# Patient Record
Sex: Male | Born: 1960 | Race: White | Hispanic: No | Marital: Married | Smoking: Former smoker
Health system: Southern US, Community
[De-identification: ages and names within clinical notes are randomized; demographics above are authoritative.]

## PROBLEM LIST (undated history)

## (undated) DIAGNOSIS — L409 Psoriasis, unspecified: Secondary | ICD-10-CM

## (undated) DIAGNOSIS — J449 Chronic obstructive pulmonary disease, unspecified: Secondary | ICD-10-CM

## (undated) DIAGNOSIS — G4700 Insomnia, unspecified: Secondary | ICD-10-CM

## (undated) DIAGNOSIS — E785 Hyperlipidemia, unspecified: Secondary | ICD-10-CM

## (undated) DIAGNOSIS — Z720 Tobacco use: Secondary | ICD-10-CM

## (undated) DIAGNOSIS — I2510 Atherosclerotic heart disease of native coronary artery without angina pectoris: Secondary | ICD-10-CM

## (undated) HISTORY — DX: Atherosclerotic heart disease of native coronary artery without angina pectoris: I25.10

## (undated) HISTORY — DX: Chronic obstructive pulmonary disease, unspecified: J44.9

## (undated) HISTORY — DX: Hyperlipidemia, unspecified: E78.5

## (undated) HISTORY — DX: Tobacco use: Z72.0

---

## 2000-05-31 ENCOUNTER — Encounter: Admission: RE | Admit: 2000-05-31 | Discharge: 2000-05-31 | Payer: Self-pay | Admitting: Family Medicine

## 2000-05-31 ENCOUNTER — Encounter: Payer: Self-pay | Admitting: Family Medicine

## 2004-10-27 ENCOUNTER — Ambulatory Visit (HOSPITAL_COMMUNITY): Admission: RE | Admit: 2004-10-27 | Discharge: 2004-10-27 | Payer: Self-pay | Admitting: Orthopedic Surgery

## 2004-10-28 ENCOUNTER — Encounter: Admission: RE | Admit: 2004-10-28 | Discharge: 2004-10-28 | Payer: Self-pay | Admitting: Orthopedic Surgery

## 2004-11-04 ENCOUNTER — Encounter: Admission: RE | Admit: 2004-11-04 | Discharge: 2004-11-04 | Payer: Self-pay | Admitting: Orthopedic Surgery

## 2007-08-14 ENCOUNTER — Encounter: Admission: RE | Admit: 2007-08-14 | Discharge: 2007-08-14 | Payer: Self-pay | Admitting: Family Medicine

## 2008-09-06 ENCOUNTER — Emergency Department (HOSPITAL_COMMUNITY): Admission: EM | Admit: 2008-09-06 | Discharge: 2008-09-07 | Payer: Self-pay | Admitting: Emergency Medicine

## 2008-09-12 ENCOUNTER — Inpatient Hospital Stay (HOSPITAL_COMMUNITY): Admission: RE | Admit: 2008-09-12 | Discharge: 2008-09-13 | Payer: Self-pay | Admitting: Cardiology

## 2010-10-01 LAB — BASIC METABOLIC PANEL
BUN: 12 mg/dL (ref 6–23)
BUN: 14 mg/dL (ref 6–23)
Calcium: 8.8 mg/dL (ref 8.4–10.5)
Chloride: 107 mEq/L (ref 96–112)
Creatinine, Ser: 0.79 mg/dL (ref 0.4–1.5)
GFR calc non Af Amer: >60 mL/min (ref >60)
GFR calc non Af Amer: >60 mL/min (ref >60)
Glucose, Bld: 93 mg/dL (ref 70–99)
Potassium: 4.1 mEq/L (ref 3.5–5.1)
Sodium: 139 mEq/L (ref 135–145)

## 2010-10-01 LAB — POCT CARDIAC MARKERS
CKMB, poc: <1 ng/mL — ABNORMAL LOW (ref 1.0–8.0)
Troponin i, poc: <0.05 ng/mL (ref 0.00–0.09)
Troponin i, poc: <0.05 ng/mL (ref 0.00–0.09)

## 2010-10-01 LAB — CBC
HCT: 40 % (ref 39.0–52.0)
Hemoglobin: 14 g/dL (ref 13.0–17.0)
MCHC: 34.9 g/dL (ref 30.0–36.0)
MCV: 93.3 fL (ref 78.0–100.0)
Platelets: 233 10*3/uL (ref 150–400)
Platelets: 258 10*3/uL (ref 150–400)
RDW: 13.1 % (ref 11.5–15.5)
WBC: 11.2 10*3/uL — ABNORMAL HIGH (ref 4.0–10.5)

## 2010-10-01 LAB — DIFFERENTIAL
Basophils Absolute: 0 10*3/uL (ref 0.0–0.1)
Basophils Relative: 1 % (ref 0–1)
Eosinophils Relative: 4 % (ref 0–5)
Lymphocytes Relative: 37 % (ref 12–46)
Neutro Abs: 3.6 10*3/uL (ref 1.7–7.7)

## 2010-11-03 NOTE — Discharge Summary (Signed)
Jacob Fitzpatrick, Jacob Fitzpatrick              ACCOUNT NO.:  [id number]   MEDICAL RECORD NO.:  [id number]          PATIENT TYPE:  INP   LOCATION:  2505                         FACILITY:  [facility]   PHYSICIAN:  Armanda Magic, M.D.     DATE OF BIRTH:  Nov 15, 1960   DATE OF ADMISSION:  09/12/2008  DATE OF DISCHARGE:  09/13/2008                               DISCHARGE SUMMARY   DISCHARGE DIAGNOSES:  1. Coronary artery disease, status post percutaneous coronary      intervention of the left anterior descending, XIENCE V X2.  2. Smoker, smoking cessation counseling.  3. Status post left hand surgery.   HOSPITAL COURSE:  [name] is a 50 year old male, who presented to  the office on September 11, 2008, for an evaluation of chest pain.  The pain  had been occurring for about 3 months.  He described it as an aching in  the upper chest and throat always with exertion.  He does have some mild  shortness of breath, but no other symptoms.   He was then brought into the hospital for a cardiac catheterization on  September 12, 2008.  He was found to have a single 90% LAD lesion.  Dr.  Amil Amen then intervened using a XIENCE V stent x2 to the LAD and he  tolerated this well.  He remained overnight.  His groin was stable and  we discharged him to home the following day.   DISCHARGE LABORATORIES:  Sodium 139, potassium 4.1, BUN 14, and  creatinine 0.78.  Hemoglobin 14.0, hematocrit 40.0, platelets 258, and  white count 11.2.  Chest x-ray from September 06, 2008, showed no acute  pulmonary process.  His EKG shows normal sinus rhythm rate 78 with no  acute ST-T wave changes.  No Q-waves.   DISCHARGE MEDICATIONS:  1. Plavix 75 mg a day.  2. Enteric-coated aspirin 325 mg a day.  3. Vitamin D as before.  4. Sublingual nitroglycerin p.r.n. chest pain.   The patient is discharged to home in stable, but improved condition.  He  is to remain on a low-sodium heart-healthy diet.  Increase activity  slowly.  No lifting over 10  pounds for 1 week.  No driving for 2 days.  Clean cath site gently  with soap and water.  No scrubbing.  Return to Dr. Norris Cross office for  fasting lab work, statin panel, on Monday, September 16, 2008.  He is to  return to see Dr. Mayford Knife on September 30, 2008 at 3 p.m.  Please note, also  we are going to send the patient home on Chantix for smoking cessation.      [name], P.A.      Armanda Magic, M.D.  Electronically Signed    LB/MEDQ  D:  09/13/2008  T:  09/13/2008  Job:  [id number]   cc:   Donia Guiles, M.D.

## 2010-11-03 NOTE — Cardiovascular Report (Signed)
Jacob Fitzpatrick, Jacob Fitzpatrick NO.:  [id number]   MEDICAL RECORD NO.:  [id number]          PATIENT TYPE:  INP   LOCATION:  2505                         FACILITY:  [facility]   PHYSICIAN:  Armanda Magic, M.D.     DATE OF BIRTH:  27-Oct-1960   DATE OF PROCEDURE:  DATE OF DISCHARGE:                            CARDIAC CATHETERIZATION   REFERRING PHYSICIAN:  Donia Guiles, MD   PROCEDURE:  1. Left heart catheterization.  2. Coronary angiography.  3. Left ventriculography.   OPERATOR:  Armanda Magic, MD   INDICATIONS:  Chest pain.   COMPLICATIONS:  None.   IV ACCESS:  Via right femoral artery, 5-French sheath.   INTRAVENOUS MEDICATIONS:  1. Versed 1 mg.  2. Fentanyl 25 mcg.   This is a 50 year old male with a strong history of tobacco use and a  family history of CAD, who presents with typical exertional angina, now  presents for cardiac catheterization.   The patient is brought to the cardiac catheterization laboratory in the  fasting nonsedated state.  Informed consent was obtained.  The patient  was connected to continuous heart rate and pulse oximetry monitoring and  intermittent blood pressure monitor.  The right groin was prepped and  draped in a sterile fashion.  Xylocaine 1% was used for local  anesthesia.  Using the modified Seldinger technique, a 5-French sheath  was placed in the right femoral artery.  Under fluoroscopic guidance, a  5-French JL-4 catheter was placed in the left coronary artery.  Multiple  cine films were taken at 30-degree RAO and 40-degree LAO views.  This  catheter was then exchanged out over a guidewire for a 5-French JR-5  catheter which could not successfully engage the coronary ostium.  The  catheter was exchanged out over a guidewire for a 5-French Williams  right coronary catheter which again initially engaged the coronary  ostium, but then jumped into the conus branch and the catheter was  withdrawn.  The catheter was exchanged out  over a guidewire for a 5-  French angled pigtail catheter which was placed in fluoroscopic guidance  in the left ventricular cavity.  Left ventriculography was performed in  the 30-degree RAO view using total of 30 mL of contrast at 15 mL per  second.  The catheter was then pulled back across the aortic valve with  no significant gradient noted.  Catheter was then exchanged out over a  guidewire for a 5-French JR-5 catheter again which successfully engaged  the coronary ostium of the right coronary artery, and multiple cine  films were taken at 30-degree RAO and 40-degree LAO views.  The catheter  was then removed over a guidewire, and the sheath was exchanged out for  a 6-French femoral artery sheath.  The patient subsequently went on to  PCI of the LAD by Dr. Amil Amen.   RESULTS:  The left main coronary artery is widely patent and bifurcates  to the left anterior descending artery and left circumflex artery.   The left anterior descending artery has a 90-95% stenosis at the takeoff  of the first diagonal branch.  The  first diagonal is widely patent.  The  ongoing LAD gives rise to a second diagonal branch which is widely  patent.  The ongoing LAD traverses to the apex and is widely patent.   The left circumflex is widely patent throughout its course in the AV  groove giving rise to 2 obtuse marginal branches, both of which were  widely patent.   The right coronary artery is widely patent, it bifurcates into a  posterior descending artery and posterolateral artery distally.  The  right coronary artery and PDA and PL are all widely patent.  Left  ventriculography shows normal LV function, EF is 60%.  Aortic pressure  102/62 mmHg, LV pressure 107/0 mmHg, LVEDP 6 mmHg.   ASSESSMENT:  1. One-vessel obstructive coronary disease of the left anterior      descending.  2. Normal left ventricular function.  3. Exertional chest pain secondary to one-vessel obstructive coronary      disease  of the left anterior descending.   PLAN:  PCI of the LAD per Dr. Amil Amen, check a fasting lipid panel.      Armanda Magic, M.D.  Electronically Signed     TT/MEDQ  D:  09/12/2008  T:  09/13/2008  Job:  [id number]   cc:   Donia Guiles, M.D.

## 2013-05-21 ENCOUNTER — Other Ambulatory Visit: Payer: Self-pay | Admitting: Family Medicine

## 2013-05-21 ENCOUNTER — Ambulatory Visit
Admission: RE | Admit: 2013-05-21 | Discharge: 2013-05-21 | Disposition: A | Discharge status: Home / Self Care | Payer: BC Managed Care – PPO | Source: Ambulatory Visit | Attending: Family Medicine | Admitting: Family Medicine

## 2013-05-21 DIAGNOSIS — J209 Acute bronchitis, unspecified: Secondary | ICD-10-CM

## 2013-10-10 ENCOUNTER — Encounter: Payer: Self-pay | Admitting: General Surgery

## 2013-10-10 DIAGNOSIS — F17200 Nicotine dependence, unspecified, uncomplicated: Secondary | ICD-10-CM

## 2013-10-10 DIAGNOSIS — I2510 Atherosclerotic heart disease of native coronary artery without angina pectoris: Secondary | ICD-10-CM

## 2013-10-10 DIAGNOSIS — E785 Hyperlipidemia, unspecified: Secondary | ICD-10-CM

## 2013-10-12 ENCOUNTER — Inpatient Hospital Stay (HOSPITAL_COMMUNITY)
Admission: EM | Admit: 2013-10-12 | Discharge: 2013-10-15 | DRG: 287 | Disposition: A | Discharge status: Home / Self Care | Payer: BC Managed Care – PPO | Attending: Cardiology | Admitting: Cardiology

## 2013-10-12 ENCOUNTER — Emergency Department (HOSPITAL_COMMUNITY): Payer: BC Managed Care – PPO

## 2013-10-12 ENCOUNTER — Encounter (HOSPITAL_COMMUNITY): Payer: Self-pay | Admitting: Emergency Medicine

## 2013-10-12 DIAGNOSIS — I214 Non-ST elevation (NSTEMI) myocardial infarction: Secondary | ICD-10-CM | POA: Diagnosis present

## 2013-10-12 DIAGNOSIS — R079 Chest pain, unspecified: Secondary | ICD-10-CM | POA: Insufficient documentation

## 2013-10-12 DIAGNOSIS — E785 Hyperlipidemia, unspecified: Secondary | ICD-10-CM | POA: Diagnosis present

## 2013-10-12 DIAGNOSIS — I2510 Atherosclerotic heart disease of native coronary artery without angina pectoris: Principal | ICD-10-CM

## 2013-10-12 DIAGNOSIS — I10 Essential (primary) hypertension: Secondary | ICD-10-CM | POA: Diagnosis present

## 2013-10-12 DIAGNOSIS — I200 Unstable angina: Secondary | ICD-10-CM

## 2013-10-12 DIAGNOSIS — Z9861 Coronary angioplasty status: Secondary | ICD-10-CM

## 2013-10-12 DIAGNOSIS — E876 Hypokalemia: Secondary | ICD-10-CM | POA: Diagnosis present

## 2013-10-12 DIAGNOSIS — I213 ST elevation (STEMI) myocardial infarction of unspecified site: Secondary | ICD-10-CM

## 2013-10-12 DIAGNOSIS — Z79899 Other long term (current) drug therapy: Secondary | ICD-10-CM

## 2013-10-12 DIAGNOSIS — Z7982 Long term (current) use of aspirin: Secondary | ICD-10-CM

## 2013-10-12 DIAGNOSIS — L408 Other psoriasis: Secondary | ICD-10-CM | POA: Diagnosis present

## 2013-10-12 DIAGNOSIS — G4700 Insomnia, unspecified: Secondary | ICD-10-CM | POA: Diagnosis present

## 2013-10-12 DIAGNOSIS — F17200 Nicotine dependence, unspecified, uncomplicated: Secondary | ICD-10-CM | POA: Diagnosis present

## 2013-10-12 HISTORY — DX: Insomnia, unspecified: G47.00

## 2013-10-12 HISTORY — DX: Psoriasis, unspecified: L40.9

## 2013-10-12 LAB — CBC
HCT: 43.6 % (ref 39.0–52.0)
Hemoglobin: 15.1 g/dL (ref 13.0–17.0)
MCH: 32.4 pg (ref 26.0–34.0)
MCHC: 34.6 g/dL (ref 30.0–36.0)
MCV: 93.6 fL (ref 78.0–100.0)
PLATELETS: 227 10*3/uL (ref 150–400)
RBC: 4.66 MIL/uL (ref 4.22–5.81)
RDW: 13.5 % (ref 11.5–15.5)
WBC: 14.4 10*3/uL — AB (ref 4.0–10.5)

## 2013-10-12 LAB — BASIC METABOLIC PANEL
BUN: 21 mg/dL (ref 6–23)
CHLORIDE: 102 meq/L (ref 96–112)
CO2: 22 meq/L (ref 19–32)
Calcium: 9 mg/dL (ref 8.4–10.5)
Creatinine, Ser: 0.94 mg/dL (ref 0.50–1.35)
GFR calc Af Amer: >90 mL/min (ref >90)
GFR calc non Af Amer: >90 mL/min (ref >90)
Glucose, Bld: 92 mg/dL (ref 70–99)
Potassium: 4.5 mEq/L (ref 3.7–5.3)
SODIUM: 138 meq/L (ref 137–147)

## 2013-10-12 LAB — TROPONIN I

## 2013-10-12 LAB — PROTIME-INR
INR: 1.01 (ref 0.00–1.49)
Prothrombin Time: 13.1 seconds (ref 11.6–15.2)

## 2013-10-12 LAB — MRSA PCR SCREENING: MRSA by PCR: NEGATIVE

## 2013-10-12 LAB — I-STAT TROPONIN, ED: TROPONIN I, POC: 0.13 ng/mL — AB (ref 0.00–0.08)

## 2013-10-12 MED ORDER — HEPARIN (PORCINE) IN NACL 100-0.45 UNIT/ML-% IJ SOLN
1400.0000 [IU]/h | INTRAMUSCULAR | Status: DC
  Administered 2013-10-12 – 2013-10-14 (×4): 1400 [IU]/h via INTRAVENOUS
  Filled 2013-10-12 (×5): qty 250

## 2013-10-12 MED ORDER — ATORVASTATIN CALCIUM 80 MG PO TABS
80.0000 mg | ORAL_TABLET | Freq: Every day | ORAL | Status: DC
Start: 2013-10-12 — End: 2013-10-13
  Filled 2013-10-12 (×3): qty 1

## 2013-10-12 MED ORDER — METOPROLOL TARTRATE 12.5 MG HALF TABLET
12.5000 mg | ORAL_TABLET | Freq: Two times a day (BID) | ORAL | Status: DC
  Administered 2013-10-12 – 2013-10-15 (×6): 12.5 mg via ORAL
  Filled 2013-10-12 (×10): qty 1

## 2013-10-12 MED ORDER — HEPARIN BOLUS VIA INFUSION
4000.0000 [IU] | Freq: Once | INTRAVENOUS | Status: AC
  Administered 2013-10-12: 4000 [IU] via INTRAVENOUS
  Filled 2013-10-12: qty 4000

## 2013-10-12 MED ORDER — ASPIRIN 81 MG PO CHEW: 324.0000 mg | CHEWABLE_TABLET | Freq: Once | ORAL | Status: DC

## 2013-10-12 MED ORDER — ASPIRIN EC 325 MG PO TBEC
325.0000 mg | DELAYED_RELEASE_TABLET | Freq: Every day | ORAL | Status: DC
Start: 2013-10-12 — End: 2013-10-13
  Administered 2013-10-13: 325 mg via ORAL
  Filled 2013-10-12 (×2): qty 1

## 2013-10-12 MED ORDER — CLOPIDOGREL BISULFATE 75 MG PO TABS
75.0000 mg | ORAL_TABLET | Freq: Every day | ORAL | Status: DC
  Administered 2013-10-13 – 2013-10-15 (×3): 75 mg via ORAL
  Filled 2013-10-12 (×5): qty 1

## 2013-10-12 NOTE — ED Provider Notes (Signed)
CSN: [id number]     Arrival date & time 10/12/13  1653 History   First MD Initiated Contact with Patient 10/12/13 1722     Chief Complaint  Patient presents with  . Chest Pain     (Consider location/radiation/quality/duration/timing/severity/associated sxs/prior Treatment) HPI Comments: Patient presents with chief complaint of chest pain. Patient was tilling his garden today. Shortly after he was finished, he developed right-sided neck pain and pain across his upper chest with mild radiation into his arm. This is associated with nausea and shortness of breath. No palpitations or diaphoresis. Pain began approximately 2:30 PM. It lasted for approximately one hour. It was resolved with nitroglycerin and aspirin. Patient was transported to the hospital by EMS. Patient states that he has been compliant with Plavix. Patient has a strong family history of coronary artery disease. Patient had angina in 2010 and had 2 stents placed after receiving catheterization. He follows up every year with his cardiologist, Dr. Mayford Knifeurner. The onset of this condition was acute. The course is resolved. Aggravating factors: none. Alleviating factors: none.   Patient had similar episode 2 days ago after working outside. Pain was mild and resolved spontaneously.    Patient is a 53 y.o. male presenting with chest pain. The history is provided by the patient, medical records and the spouse.  Chest Pain Associated symptoms: no abdominal pain, no back pain, no cough, no diaphoresis, no fever, no nausea, no palpitations, no shortness of breath and not vomiting     Past Medical History  Diagnosis Date  . Coronary artery disease     s/p PCI of LAD in 2010  . Tobacco abuse   . HLD (hyperlipidemia)    History reviewed. No pertinent past surgical history. History reviewed. No pertinent family history. History  Substance Use Topics  . Smoking status: Current Some Day Smoker -- 0.25 packs/day    Types: Cigarettes  .  Smokeless tobacco: Not on file  . Alcohol Use: Yes     Comment: 2+ serving daily    Review of Systems  Constitutional: Negative for fever and diaphoresis.  Eyes: Negative for redness.  Respiratory: Negative for cough and shortness of breath.   Cardiovascular: Positive for chest pain. Negative for palpitations and leg swelling.  Gastrointestinal: Negative for nausea, vomiting and abdominal pain.  Genitourinary: Negative for dysuria.  Musculoskeletal: Positive for neck pain. Negative for back pain.  Skin: Negative for rash.  Neurological: Negative for syncope and light-headedness.      Allergies  Review of patient's allergies indicates no known allergies.  Home Medications   Prior to Admission medications   Medication Sig Start Date End Date Taking? Authorizing Provider  aspirin 81 MG tablet Take 81 mg by mouth daily.    Historical Provider, MD  cholecalciferol (VITAMIN D) 1000 UNITS tablet Take 1,000 Units by mouth daily.    Historical Provider, MD  Clobetasol Propionate 0.05 % lotion Apply topically 2 (two) times daily.    Historical Provider, MD  clopidogrel (PLAVIX) 75 MG tablet Take 75 mg by mouth daily with breakfast.    Historical Provider, MD  desonide (VERDESO) 0.05 % foam Apply topically 2 (two) times daily.    Historical Provider, MD  hydrOXYzine (ATARAX/VISTARIL) 25 MG tablet Take 25 mg by mouth daily.    Historical Provider, MD  nitroGLYCERIN (NITROSTAT) 0.4 MG SL tablet Place 0.4 mg under the tongue every 5 (five) minutes as needed for chest pain.    Historical Provider, MD  Omega-3 Fatty Acids (FISH OIL)  1000 MG CAPS Take 1,000 mg by mouth daily.    Historical Provider, MD  rosuvastatin (CRESTOR) 20 MG tablet Take 20 mg by mouth daily.    Historical Provider, MD  vitamin C (ASCORBIC ACID) 500 MG tablet Take 500 mg by mouth daily.    Historical Provider, MD   BP 102/76  Pulse 82  Temp(Src) 97.7 F (36.5 C) (Oral)  Resp 22  SpO2 94% Physical Exam  Nursing note  and vitals reviewed. Constitutional: He appears well-developed and well-nourished.  HENT:  Head: Normocephalic and atraumatic.  Mouth/Throat: Mucous membranes are normal. Mucous membranes are not dry.  Eyes: Conjunctivae are normal.  Neck: Trachea normal and normal range of motion. Neck supple. Normal carotid pulses and no JVD present. No muscular tenderness present. Carotid bruit is not present. No tracheal deviation present.  Cardiovascular: Normal rate, regular rhythm, S1 normal, S2 normal, normal heart sounds and intact distal pulses.  Exam reveals no distant heart sounds and no decreased pulses.   No murmur heard. Pulses:      Radial pulses are 2+ on the right side, and 2+ on the left side.  Pulmonary/Chest: Effort normal and breath sounds normal. No respiratory distress. He has no wheezes. He exhibits no tenderness.  Abdominal: Soft. Bowel sounds are normal. He exhibits no pulsatile midline mass. There is no tenderness. There is no rebound and no guarding.  Musculoskeletal: He exhibits no edema.  Neurological: He is alert.  Skin: Skin is warm and dry. He is not diaphoretic. No cyanosis. No pallor.  Psychiatric: He has a normal mood and affect.    ED Course  Procedures (including critical care time) Labs Review Labs Reviewed  CBC - Abnormal; Notable for the following:    WBC 14.4 (*)    All other components within normal limits  I-STAT TROPOININ, ED - Abnormal; Notable for the following:    Troponin i, poc 0.13 (*)    All other components within normal limits  BASIC METABOLIC PANEL  TROPONIN I    Imaging Review Dg Chest Port 1 View  10/12/2013   CLINICAL DATA:  Chest and neck pain, short of breath  EXAM: PORTABLE CHEST - 1 VIEW  COMPARISON:  Prior chest x-ray 05/21/2013  FINDINGS: Stable cardiac and mediastinal contours. Slightly increased bibasilar atelectasis. Stable bronchitic changes and mild interstitial prominence. No pneumothorax or effusion. No pulmonary edema or focal  consolidation. No acute osseous abnormality.  IMPRESSION: Mild bibasilar subsegmental atelectasis.  Otherwise, stable chest x-ray.   Electronically Signed   By: Malachy Moan M.D.   On: 10/12/2013 17:30     EKG Interpretation   Date/Time:  Friday October 12 2013 17:04:47 EDT Ventricular Rate:  87 PR Interval:  147 QRS Duration: 94 QT Interval:  368 QTC Calculation: 443 R Axis:   80 Text Interpretation:  Sinus rhythm No significant change since last  tracing Confirmed by GOLDSTON  MD, SCOTT ([id number]) on 10/12/2013 5:55:18 PM      6:01 PM Patient seen and examined. Work-up initiated. EKG reviewed. Medications ordered. Discussed with Dr. Criss Alvine. Will call cardiology for consult.   Vital signs reviewed and are as follows: Filed Vitals:   10/12/13 1715  BP: 102/76  Pulse: 82  Temp:   Resp: 22   6:05 PM Cardiology consulted. They will see.   Cards admit.    MDM   Final diagnoses:  Chest pain   Admit for CP, possible NSTEMI.     Renne Crigler, PA-C 10/12/13 2308

## 2013-10-12 NOTE — Progress Notes (Signed)
ANTICOAGULATION CONSULT NOTE - Initial Consult  Pharmacy Consult for heparin Indication: chest pain/ACS  No Known Allergies  Patient Measurements:   Heparin Dosing Weight: 90kg  Vital Signs: Temp: 97.7 F (36.5 C) (04/24 1704) Temp src: Oral (04/24 1704) BP: 123/74 mmHg (04/24 1900) Pulse Rate: 84 (04/24 1900)  Labs:  Recent Labs  10/12/13 1703 10/12/13 1746  HGB 15.1  --   HCT 43.6  --   PLT 227  --   CREATININE 0.94  --   TROPONINI  --  <0.30    CrCl is unknown because there is no height on file for the current visit.   Medical History: Past Medical History  Diagnosis Date  . Coronary artery disease     s/p PCI of LAD in 2010  . Tobacco abuse   . HLD (hyperlipidemia)     Medications:   (Not in a hospital admission) Scheduled:  . aspirin EC  325 mg Oral Daily  . [START ON 10/13/2013] atorvastatin  80 mg Oral q1800  . [START ON 10/13/2013] clopidogrel  75 mg Oral Q breakfast  . metoprolol tartrate  12.5 mg Oral BID   Infusions:    Assessment: 53 yo with a hx of CAD who was admitted for CP. Starting IV heparin to r/o MI.   Goal of Therapy:  Heparin level 0.3-0.7 units/ml Monitor platelets by anticoagulation protocol: Yes   Plan:   Heparin bolus 4000 units x1 Heparin drip at 1400 units/hr Check 6 hr heparin  Daily heparin level and CBC

## 2013-10-12 NOTE — ED Notes (Signed)
Report called to Alinda Money, RN 2H.

## 2013-10-12 NOTE — ED Notes (Addendum)
Per ems: pt from home for eval of cp that started while he was working in yard. Pt went and sat down and states cp didn't get any better. Pt took 1 nitro at home took which decreased pain from 9/10 to 2/10 and fire dept gave pt another nitro took pain from  2/10 to 0/10. EKG unremarkable per EMS report. Pt denies any n/v/d/sob. nad noted, skin warm and dry. Axo x4.

## 2013-10-12 NOTE — H&P (Addendum)
Patient ID: Jacob Fitzpatrick MRN: [id number], DOB/AGE: 10/11/1960   Admit date: 10/12/2013   Primary Physician: [name], MD Primary Cardiologist: Dr Carolanne Grumbling  Pt. Profile:  Chest pain, NSTEMI  Problem List  Past Medical History  Diagnosis Date  . Coronary artery disease     s/p PCI of LAD in 2010  . Tobacco abuse   . HLD (hyperlipidemia)     History reviewed. No pertinent past surgical history.   Allergies  No Known Allergies  HPI  Patient is a 53 y.o. male with a PMHx of tobacco use, HTN, HLP, known CAD, s/p PCI LAD in for UA  In 08/2008 who presented to the ER with chief complaint of chest pain that started while he was working in the back yard today.  Shortly after he was finished, he developed right-sided neck pain and pain across his upper chest with mild radiation into his arm. This is associated with nausea and shortness of breath. No palpitations or diaphoresis. Pain began approximately 2:30 PM. It lasted for approximately one hour. It was resolved with nitroglycerin and aspirin. Patient was transported to the hospital by EMS. Patient states that he has been compliant with Plavix. He follows up every year with his cardiologist, Dr. Mayford Knife. Patient had similar episode 2 days ago after working outside. Pain was mild and resolved spontaneously.   Home Medications  Prior to Admission medications   Medication Sig Start Date End Date Taking? Authorizing Provider  aspirin 81 MG tablet Take 81 mg by mouth daily.    Historical Provider, MD  cholecalciferol (VITAMIN D) 1000 UNITS tablet Take 1,000 Units by mouth daily.    Historical Provider, MD  Clobetasol Propionate 0.05 % lotion Apply topically 2 (two) times daily.    Historical Provider, MD  clopidogrel (PLAVIX) 75 MG tablet Take 75 mg by mouth daily with breakfast.    Historical Provider, MD  desonide (VERDESO) 0.05 % foam Apply topically 2 (two) times daily.    Historical Provider, MD  hydrOXYzine  (ATARAX/VISTARIL) 25 MG tablet Take 25 mg by mouth daily.    Historical Provider, MD  nitroGLYCERIN (NITROSTAT) 0.4 MG SL tablet Place 0.4 mg under the tongue every 5 (five) minutes as needed for chest pain.    Historical Provider, MD  Omega-3 Fatty Acids (FISH OIL) 1000 MG CAPS Take 1,000 mg by mouth daily.    Historical Provider, MD  rosuvastatin (CRESTOR) 20 MG tablet Take 20 mg by mouth daily.    Historical Provider, MD  vitamin C (ASCORBIC ACID) 500 MG tablet Take 500 mg by mouth daily.    Historical Provider, MD   Family History  History reviewed. No pertinent family history.  Social History  History   Social History  . Marital Status: Married    Spouse Name: [name]    Number of Children: [name]  . Years of Education: [name]   Occupational History  . Not on file.   Social History Main Topics  . Smoking status: Current Some Day Smoker -- 0.25 packs/day    Types: Cigarettes  . Smokeless tobacco: Not on file  . Alcohol Use: Yes     Comment: 2+ serving daily  . Drug Use: No  . Sexual Activity: Not on file   Other Topics Concern  . Not on file   Social History Narrative  . No narrative on file     Review of Systems General:  No chills, fever, night sweats or weight changes.  Cardiovascular:  No  chest pain, dyspnea on exertion, edema, orthopnea, palpitations, paroxysmal nocturnal dyspnea. Dermatological: No rash, lesions/masses Respiratory: No cough, dyspnea Urologic: No hematuria, dysuria Abdominal:   No nausea, vomiting, diarrhea, bright red blood per rectum, melena, or hematemesis Neurologic:  No visual changes, wkns, changes in mental status. All other systems reviewed and are otherwise negative except as noted above.  Physical Exam  Blood pressure 102/76, pulse 82, temperature 97.7 F (36.5 C), temperature source Oral, resp. rate 22, SpO2 94.00%.  General: Pleasant, NAD Psych: Normal affect. Neuro: Alert and oriented X 3. Moves all extremities spontaneously. HEENT:  Normal  Neck: Supple without bruits or JVD. Lungs:  Resp regular and unlabored, CTA. Heart: RRR no s3, s4, or murmurs. Abdomen: Soft, non-tender, non-distended, BS + x 4.  Extremities: No clubbing, cyanosis or edema. DP/PT/Radials 2+ and equal bilaterally.  Labs  No results found for this basename: CKTOTAL, CKMB, TROPONINI,  in the last 72 hours Lab Results  Component Value Date   WBC 14.4* 10/12/2013   HGB 15.1 10/12/2013   HCT 43.6 10/12/2013   MCV 93.6 10/12/2013   PLT 227 10/12/2013   Radiology/Studies  Dg Chest Port 1 View  10/12/2013   CLINICAL DATA:  Chest and neck pain, short of breath  EXAM: PORTABLE CHEST - 1 VIEW  COMPARISON:  Prior chest x-ray 05/21/2013  FINDINGS: Stable cardiac and mediastinal contours. Slightly increased bibasilar atelectasis. Stable bronchitic changes and mild interstitial prominence. No pneumothorax or effusion. No pulmonary edema or focal consolidation. No acute osseous abnormality.  IMPRESSION: Mild bibasilar subsegmental atelectasis.  Otherwise, stable chest x-ray.   Electronically Signed   By: Malachy MoanHeath  McCullough M.D.   On: 10/12/2013 17:30   Echocardiogram - none  Cath 09/12/2008 ASSESSMENT:  1. One-vessel obstructive coronary disease of the left anterior  descending. PCI of the LAD  2. Normal left ventricular function.  3. Exertional chest pain secondary to one-vessel obstructive coronary  disease of the left anterior descending.   ECG: SR, early repolarization in the inferior leads, otherwise normal, no prior available for comparison    ASSESSMENT AND PLAN  53 year old male with known CAD, s/p PCI to LAD in 2010  1. NSTEMI - with typical chest pain, we will start Heparin drip, rosuvastatin, ASA, continue plavix, start low dose betablocker - metoprolol 12.5 mg po BID We will follow ECGs and enzymes trend, unless worsening symptoms we will plan for a cath on Monday   2. Smoking cessation - counseling provided, the patient will need nicotine  patches   3. HTN - controlled  4. HLP - start rosuvastatin 40 mg po daily  Signed, Lars MassonKatarina H Breonia Kirstein, MD, [facility] 10/12/2013, 6:15 PM

## 2013-10-13 LAB — BASIC METABOLIC PANEL
BUN: 16 mg/dL (ref 6–23)
CALCIUM: 8.8 mg/dL (ref 8.4–10.5)
CO2: 19 mEq/L (ref 19–32)
Chloride: 105 mEq/L (ref 96–112)
Creatinine, Ser: 0.78 mg/dL (ref 0.50–1.35)
GFR calc non Af Amer: >90 mL/min (ref >90)
Glucose, Bld: 102 mg/dL — ABNORMAL HIGH (ref 70–99)
Potassium: 3.7 mEq/L (ref 3.7–5.3)
Sodium: 140 mEq/L (ref 137–147)

## 2013-10-13 LAB — HEPARIN LEVEL (UNFRACTIONATED)
Heparin Unfractionated: 0.43 IU/mL (ref 0.30–0.70)
Heparin Unfractionated: 0.58 IU/mL (ref 0.30–0.70)

## 2013-10-13 LAB — CBC
HCT: 42.2 % (ref 39.0–52.0)
Hemoglobin: 14.3 g/dL (ref 13.0–17.0)
MCH: 31.9 pg (ref 26.0–34.0)
MCHC: 33.9 g/dL (ref 30.0–36.0)
MCV: 94.2 fL (ref 78.0–100.0)
PLATELETS: 213 10*3/uL (ref 150–400)
RBC: 4.48 MIL/uL (ref 4.22–5.81)
RDW: 13.7 % (ref 11.5–15.5)
WBC: 11.8 10*3/uL — AB (ref 4.0–10.5)

## 2013-10-13 LAB — TROPONIN I
Troponin I: 0.42 ng/mL (ref <0.30)
Troponin I: 0.49 ng/mL (ref <0.30)

## 2013-10-13 MED ORDER — HYDROXYZINE HCL 50 MG PO TABS
50.0000 mg | ORAL_TABLET | Freq: Every day | ORAL | Status: DC
  Administered 2013-10-13 – 2013-10-14 (×2): 50 mg via ORAL
  Filled 2013-10-13 (×3): qty 1

## 2013-10-13 MED ORDER — ROSUVASTATIN CALCIUM 20 MG PO TABS
20.0000 mg | ORAL_TABLET | Freq: Every day | ORAL | Status: DC
  Administered 2013-10-13 – 2013-10-14 (×2): 20 mg via ORAL
  Filled 2013-10-13 (×4): qty 1

## 2013-10-13 MED ORDER — POTASSIUM CHLORIDE 20 MEQ/15ML (10%) PO LIQD
ORAL | Status: AC
  Filled 2013-10-13: qty 30

## 2013-10-13 MED ORDER — SODIUM CHLORIDE 0.9 % IV SOLN
INTRAVENOUS | Status: DC
  Administered 2013-10-13: 08:00:00 via INTRAVENOUS

## 2013-10-13 MED ORDER — ASPIRIN EC 81 MG PO TBEC
81.0000 mg | DELAYED_RELEASE_TABLET | Freq: Every day | ORAL | Status: DC
  Administered 2013-10-14: 81 mg via ORAL
  Filled 2013-10-13: qty 1

## 2013-10-13 MED ORDER — POTASSIUM CHLORIDE 20 MEQ/15ML (10%) PO LIQD
40.0000 meq | Freq: Once | ORAL | Status: AC
  Administered 2013-10-13: 40 meq via ORAL
  Filled 2013-10-13: qty 30

## 2013-10-13 MED ORDER — POTASSIUM CHLORIDE CRYS ER 20 MEQ PO TBCR
40.0000 meq | EXTENDED_RELEASE_TABLET | Freq: Once | ORAL | Status: AC
  Administered 2013-10-15: 10:00:00 40 meq via ORAL
  Filled 2013-10-13 (×3): qty 2

## 2013-10-13 NOTE — Progress Notes (Signed)
ANTICOAGULATION CONSULT NOTE - Follow Up Consult  Pharmacy Consult for heparin Indication: NSTEMI  Labs:  Recent Labs  10/12/13 1703 10/12/13 1746 10/12/13 1928 10/13/13 0115 10/13/13 0350  HGB 15.1  --   --   --  14.3  HCT 43.6  --   --   --  42.2  PLT 227  --   --   --  213  LABPROT  --   --  13.1  --   --   INR  --   --  1.01  --   --   HEPARINUNFRC  --   --   --   --  0.43  CREATININE 0.94  --   --  0.78  --   TROPONINI  --  <0.30  --  0.42*  --     Assessment/Plan:  53yo male therapeutic on heparin with initial dosing for NSTEMI. Will continue gtt at current rate and confirm stable with additional level.   Vernard Gambles, PharmD, BCPS  10/13/2013,4:56 AM

## 2013-10-13 NOTE — Progress Notes (Addendum)
    Subjective:  Feeling well this morning, no chest pain. Family present. [birth year] is [birth year].  Objective:  Vital Signs in the last 24 hours: Temp:  [97.5 F (36.4 C)-98.2 F (36.8 C)] 98.2 F (36.8 C) (04/25 1201) Pulse Rate:  [46-98] 65 (04/25 1201) Resp:  [18-26] 18 (04/25 1201) BP: (101-137)/(56-86) 102/70 mmHg (04/25 1201) SpO2:  [92 %-96 %] 96 % (04/25 1201) Weight:  [194 lb 3.6 oz (88.1 kg)-196 lb (88.905 kg)] 194 lb 3.6 oz (88.1 kg) (04/25 0355)  Intake/Output from previous day: 04/24 0701 - 04/25 0700 In: 196.3 [I.V.:196.3] Out: -    Physical Exam: General: Well developed, well nourished, in no acute distress. Head:  Normocephalic and atraumatic. Lungs: Clear to auscultation and percussion. Heart: Normal S1 and S2.  No murmur, rubs or gallops.  Abdomen: soft, non-tender, positive bowel sounds. Protuberant Extremities: No clubbing or cyanosis. No edema. Neurologic: Alert and oriented x 3. Skin: Warm dry and intact    Lab Results:  Recent Labs  10/12/13 1703 10/13/13 0350  WBC 14.4* 11.8*  HGB 15.1 14.3  PLT 227 213    Recent Labs  10/12/13 1703 10/13/13 0115  NA 138 140  K 4.5 3.7  CL 102 105  CO2 22 19  GLUCOSE 92 102*  BUN 21 16  CREATININE 0.94 0.78    Recent Labs  10/13/13 0115 10/13/13 0730  TROPONINI 0.42* 0.49*    Imaging: Dg Chest Port 1 View  10/12/2013   CLINICAL DATA:  Chest and neck pain, short of breath  EXAM: PORTABLE CHEST - 1 VIEW  COMPARISON:  Prior chest x-ray 05/21/2013  FINDINGS: Stable cardiac and mediastinal contours. Slightly increased bibasilar atelectasis. Stable bronchitic changes and mild interstitial prominence. No pneumothorax or effusion. No pulmonary edema or focal consolidation. No acute osseous abnormality.  IMPRESSION: Mild bibasilar subsegmental atelectasis.  Otherwise, stable chest x-ray.   Electronically Signed   By: Malachy Moan M.D.   On: 10/12/2013 17:30   Personally viewed.   Telemetry: No  adverse arrhythmias Personally viewed.   EKG:  4/24 and 4/25-normal, no ST segment changes  . [START ON 10/14/2013] aspirin EC  81 mg Oral Daily  . atorvastatin  80 mg Oral q1800  . clopidogrel  75 mg Oral Q breakfast  . metoprolol tartrate  12.5 mg Oral BID  . potassium chloride  40 mEq Oral Once   Assessment/Plan:  Active Problems:   Chest pain   NSTEMI (non-ST elevated myocardial infarction)   53 year old male with non-ST elevation myocardial infarction prior history of PCI to LAD in 2010, tobacco use, hyperlipidemia.  1. Non-ST elevation myocardial infarction -Cardiac catheterization Monday -IV heparin -Aspirin, beta blocker, statin  2. leukocytosis-improved from 14 down to 11.8. Likely reactive. Watch for any signs of fever or infection given his Humira.  3. Mild hypokalemia-replete potassium greater than 4  4. Psoriasis-taking Humira injections. He is supposed to get one on Monday. I asked him to postpone this because of catheterization.  5. Hyperlipidemia -- he takes Crestor at home 20 mg. We will change. He has intolerance to Lipitor and simvastatin.  6. Insomnia-he requested hydroxyzine 50 mg nightly.  7. Tobacco use-tobacco cessation.  Donato Schultz 10/13/2013, 12:57 PM

## 2013-10-13 NOTE — Progress Notes (Signed)
ANTICOAGULATION CONSULT NOTE - Follow Up Consult  Pharmacy Consult for heparin Indication: chest pain/ACS  No Known Allergies  Patient Measurements: Height: 6\' 1"  (185.4 cm) Weight: 194 lb 3.6 oz (88.1 kg) IBW/kg (Calculated) : 79.9 Heparin Dosing Weight: 88 kg  Vital Signs: Temp: 97.5 F (36.4 C) (04/25 0728) Temp src: Oral (04/25 0728) BP: 117/70 mmHg (04/25 0728) Pulse Rate: 67 (04/25 0728)  Labs:  Recent Labs  10/12/13 1703 10/12/13 1746 10/12/13 1928 10/13/13 0115 10/13/13 0350 10/13/13 0730  HGB 15.1  --   --   --  14.3  --   HCT 43.6  --   --   --  42.2  --   PLT 227  --   --   --  213  --   LABPROT  --   --  13.1  --   --   --   INR  --   --  1.01  --   --   --   HEPARINUNFRC  --   --   --   --  0.43 0.58  CREATININE 0.94  --   --  0.78  --   --   TROPONINI  --  <0.30  --  0.42*  --  0.49*    Estimated Creatinine Clearance: 119.3 ml/min (by C-G formula based on Cr of 0.78).   Medications:  Scheduled:  . aspirin EC  325 mg Oral Daily  . atorvastatin  80 mg Oral q1800  . clopidogrel  75 mg Oral Q breakfast  . metoprolol tartrate  12.5 mg Oral BID    Assessment: 53 yo M with a known history of CAD (s/p PCI to LAD in 2010), admitted for chest pain, and diagnosed with NSTEMI. Pharmacy has been consulted to manage his heparin anticoagulation therapy.   His heparin level (though checked earlier than we typically would in an attempt to prevent multiple blood draws for patient preference) remains therapeutic at 0.58. His CBC remains stable, and no bleeding issues have been noted.  Patient has a planned cath procedure for Monday (4/27).  Goal of Therapy:  Heparin level 0.3-0.7 units/ml Monitor platelets by anticoagulation protocol: Yes   Plan:  -Continue heparin drip at 1400 units/hr. -Check daily heparin level and CBC -F/u bleeding complications, cath findings, anticoagulation plans after cath  Racquel Arkin C. Angelin Cutrone, PharmD Clinical  Pharmacist-Resident Pager: [phone] Pharmacy: [phone] 10/13/2013 9:28 AM

## 2013-10-14 LAB — CBC
HCT: 45.4 % (ref 39.0–52.0)
Hemoglobin: 15.5 g/dL (ref 13.0–17.0)
MCH: 32.3 pg (ref 26.0–34.0)
MCHC: 34.1 g/dL (ref 30.0–36.0)
MCV: 94.6 fL (ref 78.0–100.0)
PLATELETS: 203 10*3/uL (ref 150–400)
RBC: 4.8 MIL/uL (ref 4.22–5.81)
RDW: 13.5 % (ref 11.5–15.5)
WBC: 10.5 10*3/uL (ref 4.0–10.5)

## 2013-10-14 LAB — BASIC METABOLIC PANEL
BUN: 16 mg/dL (ref 6–23)
CALCIUM: 8.7 mg/dL (ref 8.4–10.5)
CHLORIDE: 105 meq/L (ref 96–112)
CO2: 23 mEq/L (ref 19–32)
Creatinine, Ser: 0.87 mg/dL (ref 0.50–1.35)
GFR calc Af Amer: >90 mL/min (ref >90)
GFR calc non Af Amer: >90 mL/min (ref >90)
Glucose, Bld: 100 mg/dL — ABNORMAL HIGH (ref 70–99)
Potassium: 4.6 mEq/L (ref 3.7–5.3)
SODIUM: 142 meq/L (ref 137–147)

## 2013-10-14 LAB — HEPARIN LEVEL (UNFRACTIONATED): Heparin Unfractionated: 0.58 IU/mL (ref 0.30–0.70)

## 2013-10-14 MED ORDER — SODIUM CHLORIDE 0.9 % IJ SOLN: 3.0000 mL | INTRAMUSCULAR | Status: DC | PRN

## 2013-10-14 MED ORDER — SODIUM CHLORIDE 0.9 % IV SOLN
INTRAVENOUS | Status: DC
  Administered 2013-10-14: 15:00:00 via INTRAVENOUS

## 2013-10-14 MED ORDER — SODIUM CHLORIDE 0.9 % IJ SOLN
3.0000 mL | Freq: Two times a day (BID) | INTRAMUSCULAR | Status: DC
  Administered 2013-10-14: 3 mL via INTRAVENOUS

## 2013-10-14 MED ORDER — ASPIRIN 81 MG PO CHEW
81.0000 mg | CHEWABLE_TABLET | ORAL | Status: AC
  Administered 2013-10-15: 81 mg via ORAL
  Filled 2013-10-14: qty 1

## 2013-10-14 MED ORDER — SODIUM CHLORIDE 0.9 % IV SOLN: 250.0000 mL | INTRAVENOUS | Status: DC | PRN

## 2013-10-14 MED ORDER — DIAZEPAM 5 MG PO TABS
5.0000 mg | ORAL_TABLET | ORAL | Status: AC
  Administered 2013-10-15: 5 mg via ORAL
  Filled 2013-10-14: qty 1

## 2013-10-14 MED ORDER — ASPIRIN EC 81 MG PO TBEC: 81.0000 mg | DELAYED_RELEASE_TABLET | Freq: Every day | ORAL | Status: DC

## 2013-10-14 NOTE — Progress Notes (Signed)
ANTICOAGULATION CONSULT NOTE - Follow Up Consult  Pharmacy Consult for heparin Indication: chest pain/ACS  Allergies  Allergen Reactions  . Statins     Intolerant to atorvastatin and simvastatin. Can take rosuvastatin    Patient Measurements: Height: 6\' 1"  (185.4 cm) Weight: 194 lb 3.6 oz (88.1 kg) IBW/kg (Calculated) : 79.9 Heparin Dosing Weight: 88 kg  Vital Signs: Temp: 98.1 F (36.7 C) (04/26 0820) Temp src: Oral (04/26 0820) BP: 104/73 mmHg (04/26 0820) Pulse Rate: 61 (04/26 0326)  Labs:  Recent Labs  10/12/13 1703 10/12/13 1746 10/12/13 1928 10/13/13 0115 10/13/13 0350 10/13/13 0730 10/14/13 0335  HGB 15.1  --   --   --  14.3  --  15.5  HCT 43.6  --   --   --  42.2  --  45.4  PLT 227  --   --   --  213  --  203  LABPROT  --   --  13.1  --   --   --   --   INR  --   --  1.01  --   --   --   --   HEPARINUNFRC  --   --   --   --  0.43 0.58 0.58  CREATININE 0.94  --   --  0.78  --   --  0.87  TROPONINI  --  <0.30  --  0.42*  --  0.49*  --     Estimated Creatinine Clearance: 109.7 ml/min (by C-G formula based on Cr of 0.87).   Medications:  Scheduled:  . aspirin EC  81 mg Oral Daily  . clopidogrel  75 mg Oral Q breakfast  . hydrOXYzine  50 mg Oral QHS  . metoprolol tartrate  12.5 mg Oral BID  . potassium chloride  40 mEq Oral Once  . rosuvastatin  20 mg Oral q1800    Assessment: 53 yo M with a known history of CAD (s/p PCI to LAD in 2010), admitted for chest pain, and diagnosed with NSTEMI. Pharmacy has been consulted to manage his heparin anticoagulation therapy.   His heparin level remains therapeutic at 0.58. His CBC remains stable, and no bleeding issues have been noted.  Patient has a planned cath procedure for Monday (4/27).  Goal of Therapy:  Heparin level 0.3-0.7 units/ml Monitor platelets by anticoagulation protocol: Yes   Plan:  -Continue heparin drip at 1400 units/hr. -Check daily heparin level and CBC -F/u bleeding complications,  cath findings, anticoagulation plans after cath  Nickayla Mcinnis C. Wilhelm Ganaway, PharmD Clinical Pharmacist-Resident Pager: [phone] Pharmacy: [phone] 10/14/2013 8:47 AM

## 2013-10-14 NOTE — Progress Notes (Signed)
Utilization review completed.  

## 2013-10-14 NOTE — Progress Notes (Signed)
    Subjective:  Feeling well this morning, no chest pain. Family present. His birthday is [birth year].  Objective:  Vital Signs in the last 24 hours: Temp:  [97.6 F (36.4 C)-98.3 F (36.8 C)] 98.1 F (36.7 C) (04/26 0820) Pulse Rate:  [61-70] 61 (04/26 0326) Resp:  [18-20] 20 (04/26 0820) BP: (101-115)/(61-74) 104/73 mmHg (04/26 0820) SpO2:  [91 %-96 %] 91 % (04/26 0820)  Intake/Output from previous day: 04/25 0701 - 04/26 0700 In: 1032 [P.O.:480; I.V.:552] Out: -    Physical Exam: General: Well developed, well nourished, in no acute distress. Head:  Normocephalic and atraumatic. Lungs: Clear to auscultation and percussion. Heart: Normal S1 and S2.  No murmur, rubs or gallops.  Abdomen: soft, non-tender, positive bowel sounds. Protuberant Extremities: No clubbing or cyanosis. No edema. Neurologic: Alert and oriented x 3. Skin: Warm dry and intact    Lab Results:  Recent Labs  10/13/13 0350 10/14/13 0335  WBC 11.8* 10.5  HGB 14.3 15.5  PLT 213 203    Recent Labs  10/13/13 0115 10/14/13 0335  NA 140 142  K 3.7 4.6  CL 105 105  CO2 19 23  GLUCOSE 102* 100*  BUN 16 16  CREATININE 0.78 0.87    Recent Labs  10/13/13 0115 10/13/13 0730  TROPONINI 0.42* 0.49*    Imaging: Dg Chest Port 1 View  10/12/2013   CLINICAL DATA:  Chest and neck pain, short of breath  EXAM: PORTABLE CHEST - 1 VIEW  COMPARISON:  Prior chest x-ray 05/21/2013  FINDINGS: Stable cardiac and mediastinal contours. Slightly increased bibasilar atelectasis. Stable bronchitic changes and mild interstitial prominence. No pneumothorax or effusion. No pulmonary edema or focal consolidation. No acute osseous abnormality.  IMPRESSION: Mild bibasilar subsegmental atelectasis.  Otherwise, stable chest x-ray.   Electronically Signed   By: Heath  McCullough M.D.   On: 10/12/2013 17:30   Personally viewed.   Telemetry: No adverse arrhythmias Personally viewed.   EKG:  4/24 and 4/25-normal, no ST  segment changes  . aspirin EC  81 mg Oral Daily  . clopidogrel  75 mg Oral Q breakfast  . hydrOXYzine  50 mg Oral QHS  . metoprolol tartrate  12.5 mg Oral BID  . potassium chloride  40 mEq Oral Once  . rosuvastatin  20 mg Oral q1800   Assessment/Plan:  Active Problems:   Chest pain   NSTEMI (non-ST elevated myocardial infarction)   53-year-old male with non-ST elevation myocardial infarction prior history of PCI to LAD in 2010, tobacco use, hyperlipidemia.  1. Non-ST elevation myocardial infarction -Cardiac catheterization Monday -IV heparin -Aspirin, beta blocker, statin -Troponin 0.49  2. leukocytosis-resolved  3. Mild hypokalemia-repleted potassium greater than 4  4. Psoriasis-taking Humira injections. He is supposed to get one on Monday. I asked him to postpone this because of catheterization.  5. Hyperlipidemia -- he takes Crestor at home 20 mg. We will change. He has intolerance to Lipitor and simvastatin.  6. Insomnia-he requested hydroxyzine 50 mg nightly.  7. Tobacco use-tobacco cessation.  Mark Skains 10/14/2013, 10:49 AM      

## 2013-10-15 ENCOUNTER — Encounter (HOSPITAL_COMMUNITY): Payer: Self-pay | Admitting: Nurse Practitioner

## 2013-10-15 ENCOUNTER — Encounter (HOSPITAL_COMMUNITY)
Admission: EM | Disposition: A | Discharge status: Home / Self Care | Payer: Self-pay | Source: Home / Self Care | Attending: Cardiology

## 2013-10-15 DIAGNOSIS — R079 Chest pain, unspecified: Secondary | ICD-10-CM

## 2013-10-15 DIAGNOSIS — I2510 Atherosclerotic heart disease of native coronary artery without angina pectoris: Secondary | ICD-10-CM

## 2013-10-15 DIAGNOSIS — I200 Unstable angina: Secondary | ICD-10-CM

## 2013-10-15 HISTORY — PX: LEFT HEART CATHETERIZATION WITH CORONARY ANGIOGRAM: SHX5451

## 2013-10-15 LAB — CBC
HCT: 45.6 % (ref 39.0–52.0)
Hemoglobin: 15.3 g/dL (ref 13.0–17.0)
MCH: 31.7 pg (ref 26.0–34.0)
MCHC: 33.6 g/dL (ref 30.0–36.0)
MCV: 94.4 fL (ref 78.0–100.0)
Platelets: 199 10*3/uL (ref 150–400)
RBC: 4.83 MIL/uL (ref 4.22–5.81)
RDW: 13.4 % (ref 11.5–15.5)
WBC: 9.8 10*3/uL (ref 4.0–10.5)

## 2013-10-15 LAB — BASIC METABOLIC PANEL
BUN: 15 mg/dL (ref 6–23)
CO2: 22 meq/L (ref 19–32)
CREATININE: 0.79 mg/dL (ref 0.50–1.35)
Calcium: 9.1 mg/dL (ref 8.4–10.5)
Chloride: 102 mEq/L (ref 96–112)
GFR calc non Af Amer: >90 mL/min (ref >90)
Glucose, Bld: 97 mg/dL (ref 70–99)
POTASSIUM: 4.3 meq/L (ref 3.7–5.3)
Sodium: 138 mEq/L (ref 137–147)

## 2013-10-15 LAB — HEPARIN LEVEL (UNFRACTIONATED): Heparin Unfractionated: 0.53 IU/mL (ref 0.30–0.70)

## 2013-10-15 LAB — D-DIMER, QUANTITATIVE: D-Dimer, Quant: <0.27 ug/mL-FEU (ref 0.00–0.48)

## 2013-10-15 SURGERY — LEFT HEART CATHETERIZATION WITH CORONARY ANGIOGRAM
Anesthesia: LOCAL

## 2013-10-15 MED ORDER — LIDOCAINE HCL (PF) 1 % IJ SOLN
INTRAMUSCULAR | Status: AC
  Filled 2013-10-15: qty 30

## 2013-10-15 MED ORDER — MIDAZOLAM HCL 2 MG/2ML IJ SOLN
INTRAMUSCULAR | Status: AC
  Filled 2013-10-15: qty 2

## 2013-10-15 MED ORDER — VERAPAMIL HCL 2.5 MG/ML IV SOLN
INTRAVENOUS | Status: AC
  Filled 2013-10-15: qty 2

## 2013-10-15 MED ORDER — SODIUM CHLORIDE 0.9 % IV SOLN
INTRAVENOUS | Status: AC
  Administered 2013-10-15: 10:00:00 via INTRAVENOUS

## 2013-10-15 MED ORDER — HEPARIN (PORCINE) IN NACL 2-0.9 UNIT/ML-% IJ SOLN
INTRAMUSCULAR | Status: AC
  Filled 2013-10-15: qty 500

## 2013-10-15 MED ORDER — NITROGLYCERIN 0.2 MG/ML ON CALL CATH LAB
INTRAVENOUS | Status: AC
  Filled 2013-10-15: qty 1

## 2013-10-15 MED ORDER — HEPARIN (PORCINE) IN NACL 2-0.9 UNIT/ML-% IJ SOLN
INTRAMUSCULAR | Status: AC
  Filled 2013-10-15: qty 1000

## 2013-10-15 MED ORDER — HEART ATTACK BOUNCING BOOK
Freq: Once | Status: AC
  Administered 2013-10-15: 11:00:00
  Filled 2013-10-15: qty 1

## 2013-10-15 MED ORDER — HEPARIN SODIUM (PORCINE) 1000 UNIT/ML IJ SOLN
INTRAMUSCULAR | Status: AC
  Filled 2013-10-15: qty 1

## 2013-10-15 MED ORDER — FENTANYL CITRATE 0.05 MG/ML IJ SOLN
INTRAMUSCULAR | Status: AC
  Filled 2013-10-15: qty 2

## 2013-10-15 NOTE — H&P (View-Only) (Signed)
    Subjective:  Feeling well this morning, no chest pain. Family present. His [birth year] is [birth year].  Objective:  Vital Signs in the last 24 hours: Temp:  [97.6 F (36.4 C)-98.3 F (36.8 C)] 98.1 F (36.7 C) (04/26 0820) Pulse Rate:  [61-70] 61 (04/26 0326) Resp:  [18-20] 20 (04/26 0820) BP: (101-115)/(61-74) 104/73 mmHg (04/26 0820) SpO2:  [91 %-96 %] 91 % (04/26 0820)  Intake/Output from previous day: 04/25 0701 - 04/26 0700 In: 1032 [P.O.:480; I.V.:552] Out: -    Physical Exam: General: Well developed, well nourished, in no acute distress. Head:  Normocephalic and atraumatic. Lungs: Clear to auscultation and percussion. Heart: Normal S1 and S2.  No murmur, rubs or gallops.  Abdomen: soft, non-tender, positive bowel sounds. Protuberant Extremities: No clubbing or cyanosis. No edema. Neurologic: Alert and oriented x 3. Skin: Warm dry and intact    Lab Results:  Recent Labs  10/13/13 0350 10/14/13 0335  WBC 11.8* 10.5  HGB 14.3 15.5  PLT 213 203    Recent Labs  10/13/13 0115 10/14/13 0335  NA 140 142  K 3.7 4.6  CL 105 105  CO2 19 23  GLUCOSE 102* 100*  BUN 16 16  CREATININE 0.78 0.87    Recent Labs  10/13/13 0115 10/13/13 0730  TROPONINI 0.42* 0.49*    Imaging: Dg Chest Port 1 View  10/12/2013   CLINICAL DATA:  Chest and neck pain, short of breath  EXAM: PORTABLE CHEST - 1 VIEW  COMPARISON:  Prior chest x-ray 05/21/2013  FINDINGS: Stable cardiac and mediastinal contours. Slightly increased bibasilar atelectasis. Stable bronchitic changes and mild interstitial prominence. No pneumothorax or effusion. No pulmonary edema or focal consolidation. No acute osseous abnormality.  IMPRESSION: Mild bibasilar subsegmental atelectasis.  Otherwise, stable chest x-ray.   Electronically Signed   By: Malachy Moan M.D.   On: 10/12/2013 17:30   Personally viewed.   Telemetry: No adverse arrhythmias Personally viewed.   EKG:  4/24 and 4/25-normal, no ST  segment changes  . aspirin EC  81 mg Oral Daily  . clopidogrel  75 mg Oral Q breakfast  . hydrOXYzine  50 mg Oral QHS  . metoprolol tartrate  12.5 mg Oral BID  . potassium chloride  40 mEq Oral Once  . rosuvastatin  20 mg Oral q1800   Assessment/Plan:  Active Problems:   Chest pain   NSTEMI (non-ST elevated myocardial infarction)   53 year old male with non-ST elevation myocardial infarction prior history of PCI to LAD in 2010, tobacco use, hyperlipidemia.  1. Non-ST elevation myocardial infarction -Cardiac catheterization Monday -IV heparin -Aspirin, beta blocker, statin -Troponin 0.49  2. leukocytosis-resolved  3. Mild hypokalemia-repleted potassium greater than 4  4. Psoriasis-taking Humira injections. He is supposed to get one on Monday. I asked him to postpone this because of catheterization.  5. Hyperlipidemia -- he takes Crestor at home 20 mg. We will change. He has intolerance to Lipitor and simvastatin.  6. Insomnia-he requested hydroxyzine 50 mg nightly.  7. Tobacco use-tobacco cessation.  Donato Schultz 10/14/2013, 10:49 AM

## 2013-10-15 NOTE — Discharge Instructions (Signed)

## 2013-10-15 NOTE — Interval H&P Note (Signed)
History and Physical Interval Note:  10/15/2013 7:26 AM  Jacob Fitzpatrick  has presented today for cardiac cath with the diagnosis of NSTEMI.  The various methods of treatment have been discussed with the patient and family. After consideration of risks, benefits and other options for treatment, the patient has consented to  Procedure(s): LEFT HEART CATHETERIZATION WITH CORONARY ANGIOGRAM ([name]) as a surgical intervention .  The patient's history has been reviewed, patient examined, no change in status, stable for surgery.  I have reviewed the patient's chart and labs.  Questions were answered to the patient's satisfaction.    Cath Lab Visit (complete for each Cath Lab visit)  Clinical Evaluation Leading to the Procedure:   ACS: yes  Non-ACS:    Anginal Classification: CCS IV  Anti-ischemic medical therapy: Maximal Therapy (2 or more classes of medications)  Non-Invasive Test Results: No non-invasive testing performed  Prior CABG: No previous CABG        Jacob Fitzpatrick

## 2013-10-15 NOTE — Discharge Summary (Signed)
Discharge Summary   Patient ID: Jacob Fitzpatrick,  MRN: [id number], DOB/AGE: 1961/03/10 53 y.o.  Admit date: 10/12/2013 Discharge date: 10/15/2013  Primary Care Provider: [facility] Primary Cardiologist: T. Turner, MD   Discharge Diagnoses Principal Problem:   Unstable angina  **S/P catheterization this admission revealing patent LAD stents and otherwise non-obstructive CAD.  Active Problems:   Elevated troponin   CAD (coronary artery disease)   Tobacco abuse   Hyperlipidemia  Allergies Allergies  Allergen Reactions  . Statins     Intolerant to atorvastatin and simvastatin. Can take rosuvastatin   Procedures  Cardiac Catheterization 4.27.2015  Hemodynamic Findings: Central aortic pressure: 102/63 Left ventricular pressure:104/8/15  Angiographic Findings:  Left main: 20% diffuse mid stenosis.   Left Anterior Descending Artery: Large caliber vessel that courses to the apex. The overlapping stents in the proximal and mid vessel are patent with no restenosis. There are two moderate caliber diagonal branches with no obstructive disease.   Circumflex Artery: Moderate caliber vessel with moderate caliber obtuse marginal branch. No obstructive disease.   Right Coronary Artery: Large dominant vessel with mild luminal irregularities in the proximal and mid vessel.   Left Ventricular Angiogram: LVEF=55-60%.  _____________   History of Present Illness  53 y/o male with a prior h/o CAD s/p stenting of the LAD in 2010.  He also has a h/o hyperlipidemia and tobacco abuse.  He was in his usual state of health until the day of admission when after working in his yard, he developed right-sided neck pain along with pain across his upper chest and radiating to his left arm. This was associated with nausea and dyspnea. Discomfort lasted for approximately 1 hour and prompted him to call EMS. He was treated with nitroglycerin and aspirin with eventual relief of discomfort. In the  emergency department, his ECG was nonacute while his point-of-care troponin was elevated at 0.13. He was admitted for further evaluation.  Hospital Course  Patient had no further chest discomfort following admission. His troponin rose and eventually peaked at 0.49. Given his symptoms, there was high suspicion for non-ST segment elevation myocardial infarction. He was maintained on aspirin, heparin, Plavix, and statin therapy. Beta blocker was not used secondary to baseline bradycardia. It was felt that he would require diagnostic cardiac catheterization and this was performed this morning revealing patent LAD stents and otherwise nonobstructive CAD and normal LV function. A d-dimer has been evaluated post catheterization and is normal at < 0.27.  As a result, he will be discharged home today in good condition.  Discharge Vitals Blood pressure 117/69, pulse 61, temperature 97.4 F (36.3 C), temperature source Oral, resp. rate 16, height 6\' 1"  (1.854 m), weight 194 lb 3.6 oz (88.1 kg), SpO2 95.00%.  Filed Weights   10/12/13 1926 10/13/13 0355  Weight: 196 lb (88.905 kg) 194 lb 3.6 oz (88.1 kg)   Labs  CBC  Recent Labs  10/14/13 0335 10/15/13 0310  WBC 10.5 9.8  HGB 15.5 15.3  HCT 45.4 45.6  MCV 94.6 94.4  PLT 203 199   Basic Metabolic Panel  Recent Labs  10/14/13 0335 10/15/13 0310  NA 142 138  K 4.6 4.3  CL 105 102  CO2 23 22  GLUCOSE 100* 97  BUN 16 15  CREATININE 0.87 0.79  CALCIUM 8.7 9.1   Cardiac Enzymes  Recent Labs  10/12/13 1746 10/13/13 0115 10/13/13 0730  TROPONINI <0.30 0.42* 0.49*   D Dimer Lab Results  Component Value Date   DDIMER <0.27  10/15/2013   Disposition  Pt is being discharged home today in good condition.  Follow-up Plans & Appointments      Follow-up Information   Follow up with Quintella ReichertURNER,TRACI R, MD On 10/25/2013. (9 AM)    Specialty:  Cardiology   Contact information:   [address] [place]  [address] [phone]       Follow up with Lupita RaiderSHAW,KIMBERLEE, MD. (as scheduled.)    Specialty:  Family Medicine   Contact information:   [address] [place] [address] [phone]      Discharge Medications    Medication List    STOP taking these medications       NYQUIL PO      TAKE these medications       aspirin 81 MG tablet  Take 81 mg by mouth daily.     Clobetasol Propionate 0.05 % lotion  Apply 1 tablet topically 2 (two) times daily as needed (rash).     clopidogrel 75 MG tablet  Commonly known as:  PLAVIX  Take 75 mg by mouth daily with breakfast.     HUMIRA PEN [place]  Inject 75 Units into the skin every 14 (fourteen) days.     hydrOXYzine 25 MG tablet  Commonly known as:  ATARAX/VISTARIL  Take 50 mg by mouth at bedtime as needed for itching.     nitroGLYCERIN 0.4 MG SL tablet  Commonly known as:  NITROSTAT  Place 0.4 mg under the tongue every 5 (five) minutes as needed for chest pain.     rosuvastatin 20 MG tablet  Commonly known as:  CRESTOR  Take 20 mg by mouth daily.       Outstanding Labs/Studies  None  Duration of Discharge Encounter   Greater than 30 minutes including physician time.  Signed, Ok Anishristopher R Theona Muhs NP 10/15/2013, 8:32 AM

## 2013-10-15 NOTE — Discharge Summary (Signed)
See cath note. cdm

## 2013-10-15 NOTE — ED Provider Notes (Signed)
Medical screening examination/treatment/procedure(s) were performed by non-physician practitioner and as supervising physician I was immediately available for consultation/collaboration.   EKG Interpretation   Date/Time:  Friday October 12 2013 17:04:47 EDT Ventricular Rate:  87 PR Interval:  147 QRS Duration: 94 QT Interval:  368 QTC Calculation: 443 R Axis:   80 Text Interpretation:  Sinus rhythm No significant change since last  tracing Confirmed by Jajuan Skoog  MD, Cinnamon Morency ([id number]) on 10/12/2013 5:55:18 PM        Audree Camel, MD 10/15/13 [phone]

## 2013-10-15 NOTE — Progress Notes (Signed)
Patient transported to cath lab holding via bed with monitor on & nurse in attendance.  Heparin drip off at 0645; patient up to void & Valium given.  No c/o's voiced.

## 2013-10-15 NOTE — Progress Notes (Signed)
CARDIAC REHAB PHASE I   PRE:  Rate/Rhythm: 66 SR  BP:  Supine: 131/82  Sitting:   Standing:    SaO2: 93 RA  MODE:  Ambulation: 1000 ft   POST:  Rate/Rhythm: 70 SR  BP:  Supine: 142/86  Sitting:   Standing:    SaO2: 94 RA 1055-1155  Pt tolerated ambulation well without c/o of cp or SOB. VS stable. Completed MI education with pt and wife. We discussed smoking cessation. I gave pt tips for quitting, coaching contact number and quit smart class information. I am unsure how committed he is to quitting, he does not make any comments during the discussion. Pt does agrees to Outpt. CRP in [place], will send referral.  Melina Copa RN 10/15/2013 12:06 PM

## 2013-10-15 NOTE — CV Procedure (Signed)
      Cardiac Catheterization Operative Report  Jacob Fitzpatrick [id number] 4/27/20158:14 AM [name], MD  Procedure Performed:  1. Left Heart Catheterization 2. Selective Coronary Angiography 3. Left ventricular angiogram  Operator: Verne Carrow, MD  Arterial access site:  Right radial artery.   Indication: 53 yo male with history of CAD with overlapping DES LAD in 2010, tobacco abuse, HLD admitted with chest pain. Troponin peak 0.49.                                      Procedure Details: The risks, benefits, complications, treatment options, and expected outcomes were discussed with the patient. The patient and/or family concurred with the proposed plan, giving informed consent. The patient was brought to the cath lab after IV hydration was begun and oral premedication was given. The patient was further sedated with Versed and Fentanyl. The right wrist was assessed with an Allens test which was positive. The right wrist was prepped and draped in a sterile fashion. 1% lidocaine was used for local anesthesia. Using the modified Seldinger access technique, a 5/6 French sheath was placed in the right radial artery. 3 mg Verapamil was given through the sheath. 4500 units IV heparin was given. Standard diagnostic catheters were used to perform selective coronary angiography. A pigtail catheter was used to perform a left ventricular angiogram. The sheath was removed from the right radial artery and a Terumo hemostasis band was applied at the arteriotomy site on the right wrist.    There were no immediate complications. The patient was taken to the recovery area in stable condition.   Hemodynamic Findings: Central aortic pressure: 102/63 Left ventricular pressure:104/8/15  Angiographic Findings:  Left main: 20% diffuse mid stenosis.   Left Anterior Descending Artery: Large caliber vessel that courses to the apex. The overlapping stents in the proximal and mid vessel are  patent with no restenosis. There are two moderate caliber diagonal branches with no obstructive disease.   Circumflex Artery: Moderate caliber vessel with moderate caliber obtuse marginal branch. No obstructive disease.   Right Coronary Artery: Large dominant vessel with mild luminal irregularities in the proximal and mid vessel.   Left Ventricular Angiogram: LVEF=55-60%.   Impression: 1. Single vessel CAD with patent stents proximal and mid LAD 2. Normal LV systolic function 3. Uncertain etiology of chest pain and mildly elevated troponin.   Recommendations: Continue medical management of CAD. Would discharge home later today after discharge.        Complications:  None. The patient tolerated the procedure well.

## 2013-10-16 NOTE — Care Management Note (Addendum)
  Page 1 of 1   10/16/2013     11:24:35 AM CARE MANAGEMENT NOTE 10/16/2013  Patient:  Jacob Fitzpatrick, Jacob Fitzpatrick   Account Number:  [id number]  Date Initiated:  10/15/2013  Documentation initiated by:  Junius Creamer  Subjective/Objective Assessment:   adm w ch pain     Action/Plan:   lives w wife, pcp dr Philipp Deputy   Anticipated DC Date:  10/15/2013   Anticipated DC Plan:  HOME/SELF CARE         Choice offered to / List presented to:             Status of service:  Completed, signed off Medicare Important Message given?   (If response is "NO", the following Medicare IM given date fields will be blank) Date Medicare IM given:   Date Additional Medicare IM given:    Discharge Disposition:  HOME/SELF CARE  Per UR Regulation:  Reviewed for med. necessity/level of care/duration of stay  If discussed at Long Length of Stay Meetings, dates discussed:    Comments:  D/c home 10/15/2013  self care / Plavix Denesha Brouse RN, BSN, MSHL, CCM 10/16/2013

## 2013-10-25 ENCOUNTER — Encounter: Payer: Self-pay | Admitting: Cardiology

## 2013-10-25 ENCOUNTER — Ambulatory Visit (INDEPENDENT_AMBULATORY_CARE_PROVIDER_SITE_OTHER): Payer: BC Managed Care – PPO | Admitting: Cardiology

## 2013-10-25 VITALS — BP 134/90 | HR 71 | Ht 73.0 in | Wt 196.0 lb

## 2013-10-25 DIAGNOSIS — I2510 Atherosclerotic heart disease of native coronary artery without angina pectoris: Secondary | ICD-10-CM

## 2013-10-25 DIAGNOSIS — IMO0002 Reserved for concepts with insufficient information to code with codable children: Secondary | ICD-10-CM

## 2013-10-25 DIAGNOSIS — I252 Old myocardial infarction: Secondary | ICD-10-CM | POA: Insufficient documentation

## 2013-10-25 DIAGNOSIS — I10 Essential (primary) hypertension: Secondary | ICD-10-CM

## 2013-10-25 DIAGNOSIS — I219 Acute myocardial infarction, unspecified: Secondary | ICD-10-CM

## 2013-10-25 DIAGNOSIS — E785 Hyperlipidemia, unspecified: Secondary | ICD-10-CM

## 2013-10-25 MED ORDER — METOPROLOL SUCCINATE ER 25 MG PO TB24: 12.5000 mg | ORAL_TABLET | Freq: Every day | ORAL | Status: DC

## 2013-10-25 MED ORDER — NITROGLYCERIN 0.4 MG SL SUBL: 0.4000 mg | SUBLINGUAL_TABLET | SUBLINGUAL | Status: AC | PRN

## 2013-10-25 NOTE — Patient Instructions (Addendum)
Your physician has recommended you make the following change in your medication:   1. Start Toprol 12.5 MG 1 tablet daily.  2. We sent a rx in for Nitroglycerin for you as well  Your physician recommends that you schedule a follow-up appointment in: 1 Week with Nurse for HR and BP check  Your physician wants you to follow-up in: 6 months with Dr Sherlyn Lick will receive a reminder letter in the mail two months in advance. If you don't receive a letter, please call our office to schedule the follow-up appointment.

## 2013-10-25 NOTE — Progress Notes (Signed)
[address] [place], [place]  [address] Phone: [phone]([phone] Fax:  [phone]) [phone]  Date:  10/25/2013   ID:  Jacob BrasSteven E Luty, DOB 08/27/1960, MRN [id number]  PCP:  Lupita RaiderSHAW,KIMBERLEE, MD  Cardiologist:  Armanda Magicraci TUrner, MD     History of Present Illness: Jacob Fitzpatrick is a 53 y.o. male with a history of ASCAD, dyslipidemia.  He is doing well.  He was admitted on 10/12/2013 with right sided neck pain that went into his chest pain c/w USAP.  He ruled out for MI and underwent cardiac cath showing 20% LM, patent overlapping stents in the prox to mid vessel with no stenosis, patent left circ and RCA and normal LVF.  His cardiac enzymes peaked with a troponin of 0.49.  He was not started on a beta blocker  Due to bradycardia.    Wt Readings from Last 3 Encounters:  10/25/13 196 lb (88.905 kg)  10/13/13 194 lb 3.6 oz (88.1 kg)  10/13/13 194 lb 3.6 oz (88.1 kg)     Past Medical History  Diagnosis Date  . Coronary artery disease     a. s/p PCI of LAD in 2010;  b. 09/2013 Elev Trop/Cath: LM 2639m, LAD patent prox/mid stents, D1/2 nl, LCX nl, RCA dominant, min irregs p/m, EF 55-60%->Med Rx.  . Tobacco abuse   . HLD (hyperlipidemia)   . Psoriasis   . Insomnia     Current Outpatient Prescriptions  Medication Sig Dispense Refill  . Adalimumab (HUMIRA PEN [employer]) Inject 75 Units into the skin every 14 (fourteen) days.      Marland Kitchen. aspirin 81 MG tablet Take 81 mg by mouth daily.      . Clobetasol Propionate 0.05 % lotion Apply 1 tablet topically 2 (two) times daily as needed (rash).       . clopidogrel (PLAVIX) 75 MG tablet Take 75 mg by mouth daily with breakfast.      . hydrOXYzine (ATARAX/VISTARIL) 25 MG tablet Take 50 mg by mouth at bedtime as needed for itching.       . nitroGLYCERIN (NITROSTAT) 0.4 MG SL tablet Place 0.4 mg under the tongue every 5 (five) minutes as needed for chest pain.      . rosuvastatin (CRESTOR) 20 MG tablet Take 20 mg by mouth daily.       No current facility-administered  medications for this visit.    Allergies:    Allergies  Allergen Reactions  . Statins     Intolerant to atorvastatin and simvastatin. Can take rosuvastatin    Social History:  The patient  reports that he has been smoking Cigarettes.  He has been smoking about 0.25 packs per day. He does not have any smokeless tobacco history on file. He reports that he drinks alcohol. He reports that he does not use illicit drugs.   Family History:  The patient's family history is not on file.   ROS:  Please see the history of present illness.      All other systems reviewed and negative.   PHYSICAL EXAM: VS:  BP 134/90  Pulse 71  Ht 6\' 1"  (1.854 m)  Wt 196 lb (88.905 kg)  BMI 25.86 kg/m2 Well nourished, well developed, in no acute distress HEENT: normal Neck: no JVD Cardiac:  normal S1, S2; RRR; no murmur Lungs:  clear to auscultation bilaterally, no wheezing, rhonchi or rales Abd: soft, nontender, no hepatomegaly Ext: no edema Skin: warm and dry Neuro:  CNs 2-12 intact, no focal abnormalities  noted  EKG:  NSR with no ST changes     ASSESSMENT AND PLAN:  1. ASCAD with recent admission for NSTEMI with cath showing patent vessels and stents.  ? Secondary do vasospasm.  He was not placed on a beta blocker in the hospital due to bradycardia but his HR is fine now.  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 - Toprol XL 12.5mg  daily - continue ASA/Plavix 2. Dyslipidemia - fasting lipid panel and ALT with his PCP in the near future - He will have her send me a copy of the results. - continue Crestor  Followup with my nurse in 1 week for BP and HR check  Followup with me in 6 months  Signed, Armanda Magic, MD 10/25/2013 9:44 AM

## 2013-11-01 ENCOUNTER — Ambulatory Visit (INDEPENDENT_AMBULATORY_CARE_PROVIDER_SITE_OTHER): Payer: BC Managed Care – PPO

## 2013-11-01 VITALS — BP 130/90 | HR 64

## 2013-11-01 DIAGNOSIS — I10 Essential (primary) hypertension: Secondary | ICD-10-CM

## 2013-11-01 NOTE — Patient Instructions (Signed)
Your physician recommends that you continue on your current medications as directed. Please refer to the Current Medication list given to you today.     

## 2013-11-01 NOTE — Progress Notes (Signed)
1.) Reason for visit: BP and pulse check   2.) Name of MD requesting visit: Dr Mayford Knife        3.)  H&P: 10/25/13 pt started on Metoprolol Succinate 25 mg take one-half tablet daily.   3.) ROS related to problem: Pt has no complaints since starting medication.   4.) Assessment and plan per MD: Discussed with Dr Mayford Knife who is in the office and no changes in medication.   VS and note from above reviewed.  Agree with assessment and plan  Armanda Magic, MD

## 2013-11-13 ENCOUNTER — Other Ambulatory Visit: Payer: Self-pay | Admitting: Cardiology

## 2013-12-17 ENCOUNTER — Ambulatory Visit
Admission: RE | Admit: 2013-12-17 | Discharge: 2013-12-17 | Disposition: A | Discharge status: Home / Self Care | Payer: BC Managed Care – PPO | Source: Ambulatory Visit | Attending: Family Medicine | Admitting: Family Medicine

## 2013-12-17 ENCOUNTER — Other Ambulatory Visit: Payer: Self-pay | Admitting: Family Medicine

## 2013-12-17 ENCOUNTER — Encounter: Payer: Self-pay | Admitting: Cardiology

## 2013-12-17 DIAGNOSIS — R059 Cough, unspecified: Secondary | ICD-10-CM

## 2013-12-17 DIAGNOSIS — R05 Cough: Secondary | ICD-10-CM

## 2014-04-17 ENCOUNTER — Other Ambulatory Visit: Payer: Self-pay | Admitting: Cardiology

## 2014-04-23 ENCOUNTER — Ambulatory Visit (INDEPENDENT_AMBULATORY_CARE_PROVIDER_SITE_OTHER): Payer: BC Managed Care – PPO | Admitting: Cardiology

## 2014-04-23 ENCOUNTER — Other Ambulatory Visit: Payer: Self-pay

## 2014-04-23 ENCOUNTER — Encounter: Payer: Self-pay | Admitting: Cardiology

## 2014-04-23 VITALS — BP 112/74 | HR 72 | Ht 72.0 in | Wt 193.8 lb

## 2014-04-23 DIAGNOSIS — E785 Hyperlipidemia, unspecified: Secondary | ICD-10-CM

## 2014-04-23 DIAGNOSIS — J449 Chronic obstructive pulmonary disease, unspecified: Secondary | ICD-10-CM | POA: Insufficient documentation

## 2014-04-23 DIAGNOSIS — I2584 Coronary atherosclerosis due to calcified coronary lesion: Secondary | ICD-10-CM

## 2014-04-23 DIAGNOSIS — I252 Old myocardial infarction: Secondary | ICD-10-CM

## 2014-04-23 DIAGNOSIS — Z720 Tobacco use: Secondary | ICD-10-CM | POA: Insufficient documentation

## 2014-04-23 DIAGNOSIS — J42 Unspecified chronic bronchitis: Secondary | ICD-10-CM

## 2014-04-23 DIAGNOSIS — I2510 Atherosclerotic heart disease of native coronary artery without angina pectoris: Secondary | ICD-10-CM

## 2014-04-23 MED ORDER — ROSUVASTATIN CALCIUM 20 MG PO TABS: 40.0000 mg | ORAL_TABLET | Freq: Every day | ORAL | Status: DC

## 2014-04-23 NOTE — Progress Notes (Signed)
[address] [place], [place]  [address] Phone: [phone] Fax:  [phone]  Date:  04/23/2014   ID:  Jacob BrasSteven E Cassarino, DOB 1960-09-29, MRN [id number]  PCP:  Lupita RaiderSHAW,KIMBERLEE, MD  Cardiologist:  Armanda Magicraci Ludell Zacarias, MD    History of Present Illness: Jacob Fitzpatrick is a 53 y.o. male with a history of ASCAD with prior LAD PCI, dyslipidemia. He is doing well. He was admitted on 10/12/2013 with right sided neck pain that went into his chest pain c/w USAP. He ruled out for MI and underwent cardiac cath showing 20% LM, patent overlapping stents in the prox to mid vessel with no stenosis, patent left circ and RCA and normal LVF. His cardiac enzymes peaked with a troponin of 0.49.He presents back today for followup.  He denies any chest pain, LE edema, dizziness, palpitations or syncope.  He has had some SOB and was recently diagnosed with COPD.  He has been using inhalers.   Wt Readings from Last 3 Encounters:  04/23/14 193 lb 12.8 oz (87.907 kg)  10/25/13 196 lb (88.905 kg)  10/13/13 194 lb 3.6 oz (88.1 kg)     Past Medical History  Diagnosis Date  . Coronary artery disease     a. s/p PCI of LAD in 2010;  b. 09/2013 Elev Trop/Cath: LM 4639m, LAD patent prox/mid stents, D1/2 nl, LCX nl, RCA dominant, min irregs p/m, EF 55-60%->Med Rx.  . Tobacco abuse   . HLD (hyperlipidemia)   . Psoriasis   . Insomnia   . COPD (chronic obstructive pulmonary disease)     Current Outpatient Prescriptions  Medication Sig Dispense Refill  . aspirin 81 MG tablet Take 81 mg by mouth daily.    Marland Kitchen. buPROPion (WELLBUTRIN SR) 150 MG 12 hr tablet Take 150 mg by mouth 2 (two) times daily.  11  . CIALIS 5 MG tablet Take 5 mg by mouth daily as needed.  6  . Clobetasol Propionate 0.05 % lotion Apply 1 tablet topically 2 (two) times daily as needed (rash).     . clopidogrel (PLAVIX) 75 MG tablet TAKE 1 TABLET DAILY 90 tablet 0  . hydrOXYzine (ATARAX/VISTARIL) 25 MG tablet Take 50 mg by mouth at bedtime as  needed for itching.     . metoprolol succinate (TOPROL XL) 25 MG 24 hr tablet Take 0.5 tablets (12.5 mg total) by mouth daily. 15 tablet 11  . nitroGLYCERIN (NITROSTAT) 0.4 MG SL tablet Place 1 tablet (0.4 mg total) under the tongue every 5 (five) minutes as needed for chest pain. 25 tablet 3  . OTEZLA 30 MG TABS Take 30 mg by mouth 2 (two) times daily.    . rosuvastatin (CRESTOR) 20 MG tablet Take 20 mg by mouth daily.    . SYMBICORT 160-4.5 MCG/ACT inhaler   6   No current facility-administered medications for this visit.    Allergies:    Allergies  Allergen Reactions  . Statins     Intolerant to atorvastatin and simvastatin. Can take rosuvastatin    Social History:  The patient  reports that he has quit smoking. His smoking use included Cigarettes. He smoked 0.25 packs per day. He does not have any smokeless tobacco history on file. He reports that he drinks alcohol. He reports that he does not use illicit drugs.   Family History:  The patient's family history includes Peripheral vascular disease in his father.   ROS:  Please see the history of present illness.  All other systems reviewed and negative.   PHYSICAL EXAM: VS:  BP 112/74 mmHg  Pulse 72  Ht 6' (1.829 m)  Wt 193 lb 12.8 oz (87.907 kg)  BMI 26.28 kg/m2 Well nourished, well developed, in no acute distress HEENT: normal Neck: no JVD Cardiac:  normal S1, S2; RRR; no murmur Lungs:  clear to auscultation bilaterally, no wheezing, rhonchi or rales Abd: soft, nontender, no hepatomegaly Ext: no edema Skin: warm and dry Neuro:  CNs 2-12 intact, no focal abnormalities noted  ASSESSMENT AND PLAN:  1.  ASCAD with history of NSTEMI with cath showing patent vessels and stents. ? Secondary to vasospasm.  - Continue Toprol XL daily - continue ASA/Plavix 2. Dyslipidemia -Last LDL was 88 and HDL 37, TAG 218 and LFTs were normal in June 2015.  His LDL goal is <70 - increasd Crestor to 40mg  daily and recheck FLP and ALT  in 6 weeks 3.  COPD - continue inhalers per PCP  Followup with me in 6 months   Signed, Armanda Magic, MD [facility] 04/23/2014 9:28 AM

## 2014-04-23 NOTE — Patient Instructions (Signed)
Your physician has recommended you make the following change in your medication:  1) INCREASE Crestor to 40 mg daily  Your physician recommends that you return for lab work on December 15 between 7:30 and 5:00 PM. (FASTING labs)  Your physician wants you to follow-up in: 6 months with Dr. Mayford Knife. You will receive a reminder letter in the mail two months in advance. If you don't receive a letter, please call our office to schedule the follow-up appointment.

## 2014-05-30 ENCOUNTER — Encounter (HOSPITAL_COMMUNITY): Payer: Self-pay | Admitting: Cardiovascular Disease

## 2014-06-04 ENCOUNTER — Other Ambulatory Visit (INDEPENDENT_AMBULATORY_CARE_PROVIDER_SITE_OTHER): Payer: BC Managed Care – PPO | Admitting: *Deleted

## 2014-06-04 DIAGNOSIS — E785 Hyperlipidemia, unspecified: Secondary | ICD-10-CM

## 2014-06-04 LAB — LIPID PANEL
CHOL/HDL RATIO: 5
Cholesterol: 159 mg/dL (ref 0–200)
HDL: 33.3 mg/dL — ABNORMAL LOW (ref >39.00)
NonHDL: 125.7
Triglycerides: 288 mg/dL — ABNORMAL HIGH (ref 0.0–149.0)
VLDL: 57.6 mg/dL — AB (ref 0.0–40.0)

## 2014-06-04 LAB — LDL CHOLESTEROL, DIRECT: LDL DIRECT: 91.3 mg/dL

## 2014-06-04 LAB — ALT: ALT: 47 U/L (ref 0–53)

## 2014-06-12 ENCOUNTER — Telehealth: Payer: Self-pay

## 2014-06-12 DIAGNOSIS — E785 Hyperlipidemia, unspecified: Secondary | ICD-10-CM

## 2014-06-12 NOTE — Telephone Encounter (Signed)
-----   Message from Quintella Reichert, MD sent at 06/04/2014  4:38 PM EST ----- Lipids still not at goal.  Please forward to lipid clinic for further recommendations.

## 2014-06-12 NOTE — Telephone Encounter (Signed)
Patient informed of lab results and verbal understanding expressed.  Patient agrees to Lipid Clinic. Referral placed for scheduling.

## 2014-06-26 ENCOUNTER — Telehealth: Payer: Self-pay | Admitting: Pharmacist

## 2014-06-26 DIAGNOSIS — E785 Hyperlipidemia, unspecified: Secondary | ICD-10-CM

## 2014-06-26 MED ORDER — EZETIMIBE 10 MG PO TABS
10.0000 mg | ORAL_TABLET | Freq: Every day | ORAL | Status: DC
Start: 2014-06-26 — End: 2014-07-01

## 2014-06-26 NOTE — Telephone Encounter (Signed)
Called patient to discuss lipid panel results.  He was scheduled for the lipid clinic for 1/7 but change was easily made over the phone.  Pt's Crestor was increased from 20mg  to 40mg  in November.  Labs in December showed an LDL on 91 (goal<70).  Pt states he is tolerating medication okay; he thinks he may have had some muscle aches and maybe more tired but doesn't necessarily attribute that to the Crestor.  Given he is on max dose of statin, will add Zetia 10mg  daily to his regimen and recheck labs in 2 months.  He is agreeable to this plan.  Rx sent and appt made.

## 2014-06-27 ENCOUNTER — Ambulatory Visit: Payer: BC Managed Care – PPO | Admitting: Pharmacist

## 2014-06-27 ENCOUNTER — Other Ambulatory Visit: Payer: Self-pay

## 2014-07-01 ENCOUNTER — Telehealth: Payer: Self-pay | Admitting: Cardiology

## 2014-07-01 NOTE — Telephone Encounter (Signed)
New message      Pt states he is allergic to zetia.  He talked to a nurse last week to get something else called in but nothing has been called in.  Please call

## 2014-07-01 NOTE — Addendum Note (Signed)
Addended by: Audrie Lia R on: 07/01/2014 03:30 PM   Modules accepted: Orders, Medications

## 2014-07-01 NOTE — Telephone Encounter (Signed)
Patient st he never got his Zetia filled per [facility] instruction last week - his wife remembered that when he was at [employer] he had an extreme reaction to it (he st it caused really bad bruising and discomfort).   To Kennon Rounds for recommendations.

## 2014-07-01 NOTE — Telephone Encounter (Signed)
Pt called back last week to say his wife remembered him taking Zetia in 2010 and not tolerating due to rash.  Rose, CMA verified this information with [place] records and added to our chart.  Spoke with pt today.  He is already on Crestor 40mg  daily and already taking monoclonal antibody for psoriasis.  Given this, will not consider PCSK-9 inhibitor at this time.  Will have him continue just Crestor 40mg  daily and recheck labs in 3 months.

## 2014-07-02 NOTE — Telephone Encounter (Signed)
Documented follow up in phone call on 1/6.  See that note for details.

## 2014-07-11 ENCOUNTER — Other Ambulatory Visit: Payer: Self-pay | Admitting: Cardiology

## 2014-07-22 ENCOUNTER — Other Ambulatory Visit: Payer: Self-pay | Admitting: *Deleted

## 2014-07-22 MED ORDER — ROSUVASTATIN CALCIUM 40 MG PO TABS: 40.0000 mg | ORAL_TABLET | Freq: Every day | ORAL | Status: DC

## 2014-08-27 ENCOUNTER — Telehealth: Payer: Self-pay | Admitting: Cardiology

## 2014-08-27 NOTE — Telephone Encounter (Signed)
He needs to see his PCP 

## 2014-08-27 NOTE — Telephone Encounter (Signed)
New Message  Pt called to speak w/ Rn. Pt exp numbness in fingers- ring finger on left hand- tingley and cold to the touch (started last night). Left foot tingling this morning, dizziness this morning. Pt requested to speak w/ Rn. Please call back and discuss.

## 2014-08-27 NOTE — Telephone Encounter (Signed)
Patient st starting last night his ring finger and pinky on his left hand and his two smaller toes on his left foot have been chilly and tingly. He does not complain of swelling or discoloration. Patient has been diagnosed with Raynaud's previously. Patient also st this morning patient experienced brief episode of dizziness (5 sec) when he got out of bed. It quickly subsided but happened once more a little while later for less than 5 seconds. Patient c/o chest pain in the L breast when he coughs. He st that he has COPD and has been congested. The pain only occurs when coughing.  BP was 148/88 hr 85.  Patient requests Dr. Norris Cross recommendations.

## 2014-08-28 ENCOUNTER — Other Ambulatory Visit (INDEPENDENT_AMBULATORY_CARE_PROVIDER_SITE_OTHER): Payer: BLUE CROSS/BLUE SHIELD | Admitting: *Deleted

## 2014-08-28 DIAGNOSIS — E785 Hyperlipidemia, unspecified: Secondary | ICD-10-CM

## 2014-08-28 LAB — LIPID PANEL
Cholesterol: 174 mg/dL (ref 0–200)
HDL: 38.7 mg/dL — AB (ref >39.00)
NonHDL: 135.3
TRIGLYCERIDES: 244 mg/dL — AB (ref 0.0–149.0)
Total CHOL/HDL Ratio: 4
VLDL: 48.8 mg/dL — AB (ref 0.0–40.0)

## 2014-08-28 LAB — HEPATIC FUNCTION PANEL
ALT: 44 U/L (ref 0–53)
AST: 29 U/L (ref 0–37)
Albumin: 4.3 g/dL (ref 3.5–5.2)
Alkaline Phosphatase: 58 U/L (ref 39–117)
BILIRUBIN DIRECT: 0.1 mg/dL (ref 0.0–0.3)
BILIRUBIN TOTAL: 0.5 mg/dL (ref 0.2–1.2)
Total Protein: 7.4 g/dL (ref 6.0–8.3)

## 2014-08-28 LAB — LDL CHOLESTEROL, DIRECT: Direct LDL: 102 mg/dL

## 2014-08-28 NOTE — Telephone Encounter (Signed)
Advised patient of Dr. Norris Cross recommendation.  He stated that he called PCP yesterday and they are going to contact Dr. Clelia Croft (who is out from hip surgery) to see what she recommends. He will call back if still needs follow up secondary to PCP out of office.

## 2014-08-30 ENCOUNTER — Other Ambulatory Visit: Payer: Self-pay | Admitting: *Deleted

## 2014-08-30 DIAGNOSIS — E785 Hyperlipidemia, unspecified: Secondary | ICD-10-CM

## 2014-09-04 ENCOUNTER — Encounter: Payer: Self-pay | Admitting: Cardiology

## 2014-09-04 NOTE — Telephone Encounter (Signed)
This encounter was created in error - please disregard.

## 2014-09-04 NOTE — Telephone Encounter (Signed)
New message ° ° ° ° ° °Returning Katy's call from yesterday °

## 2014-09-05 ENCOUNTER — Other Ambulatory Visit: Payer: Self-pay | Admitting: Cardiology

## 2014-10-08 ENCOUNTER — Other Ambulatory Visit: Payer: Self-pay | Admitting: Cardiology

## 2014-10-30 ENCOUNTER — Other Ambulatory Visit (INDEPENDENT_AMBULATORY_CARE_PROVIDER_SITE_OTHER): Payer: BLUE CROSS/BLUE SHIELD | Admitting: *Deleted

## 2014-10-30 DIAGNOSIS — E785 Hyperlipidemia, unspecified: Secondary | ICD-10-CM | POA: Diagnosis not present

## 2014-10-30 LAB — LIPID PANEL
CHOLESTEROL: 136 mg/dL (ref 0–200)
HDL: 38.8 mg/dL — AB (ref >39.00)
LDL CALC: 69 mg/dL (ref 0–99)
NonHDL: 97.2
Total CHOL/HDL Ratio: 4
Triglycerides: 142 mg/dL (ref 0.0–149.0)
VLDL: 28.4 mg/dL (ref 0.0–40.0)

## 2014-10-30 LAB — ALT: ALT: 36 U/L (ref 0–53)

## 2015-01-05 ENCOUNTER — Other Ambulatory Visit: Payer: Self-pay | Admitting: Cardiology

## 2015-01-06 ENCOUNTER — Other Ambulatory Visit: Payer: Self-pay

## 2015-01-06 MED ORDER — CLOPIDOGREL BISULFATE 75 MG PO TABS: 75.0000 mg | ORAL_TABLET | Freq: Every day | ORAL | Status: AC

## 2015-01-29 ENCOUNTER — Other Ambulatory Visit: Payer: Self-pay | Admitting: Cardiology

## 2015-08-19 IMAGING — CR DG CHEST 2V
2 series · 2 of 2 positions shown · non-contrast
Comparison: 09/06/2008

CLINICAL DATA: Acute bronchitis

EXAM:
CHEST  2 VIEW

[w chest pa]
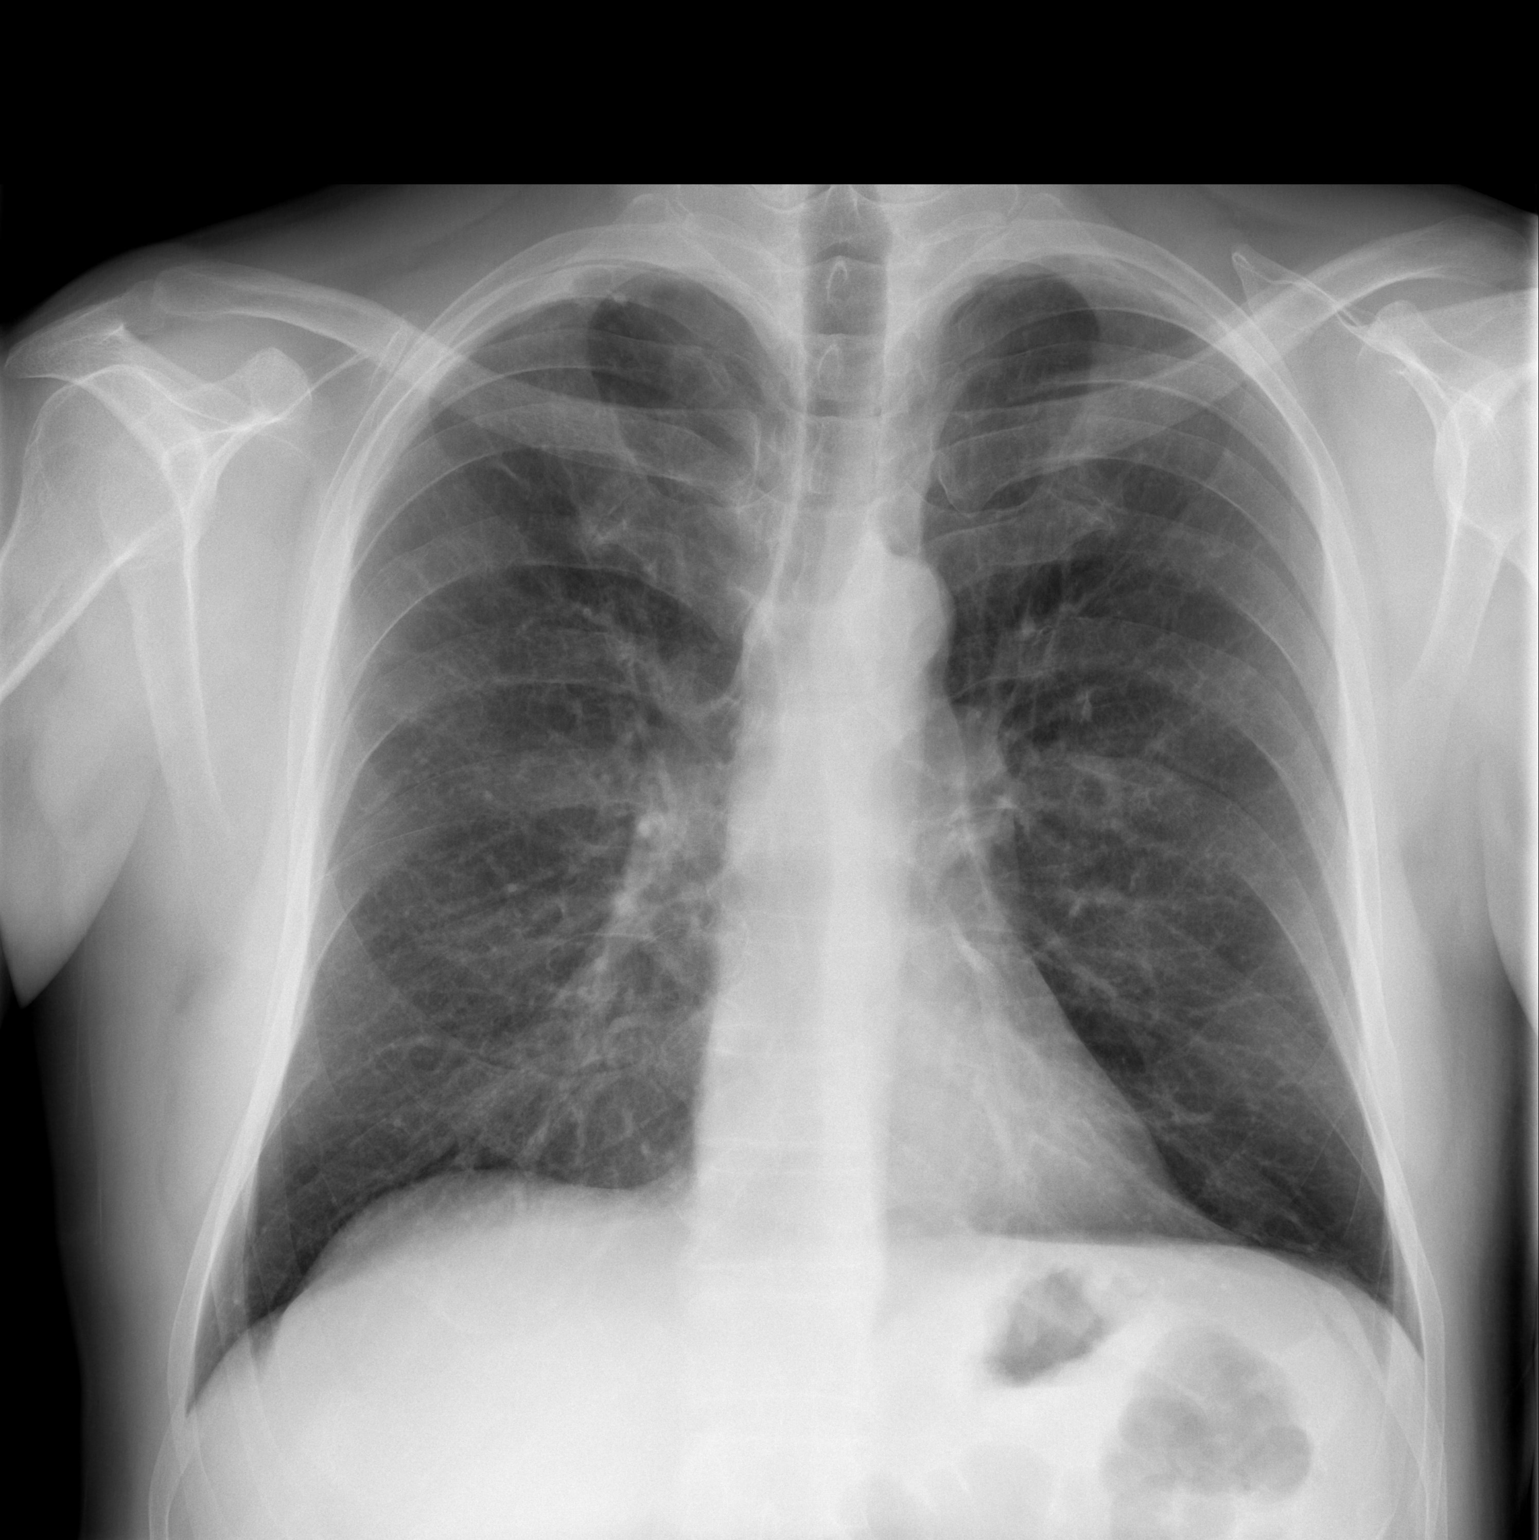

[w chest lat]
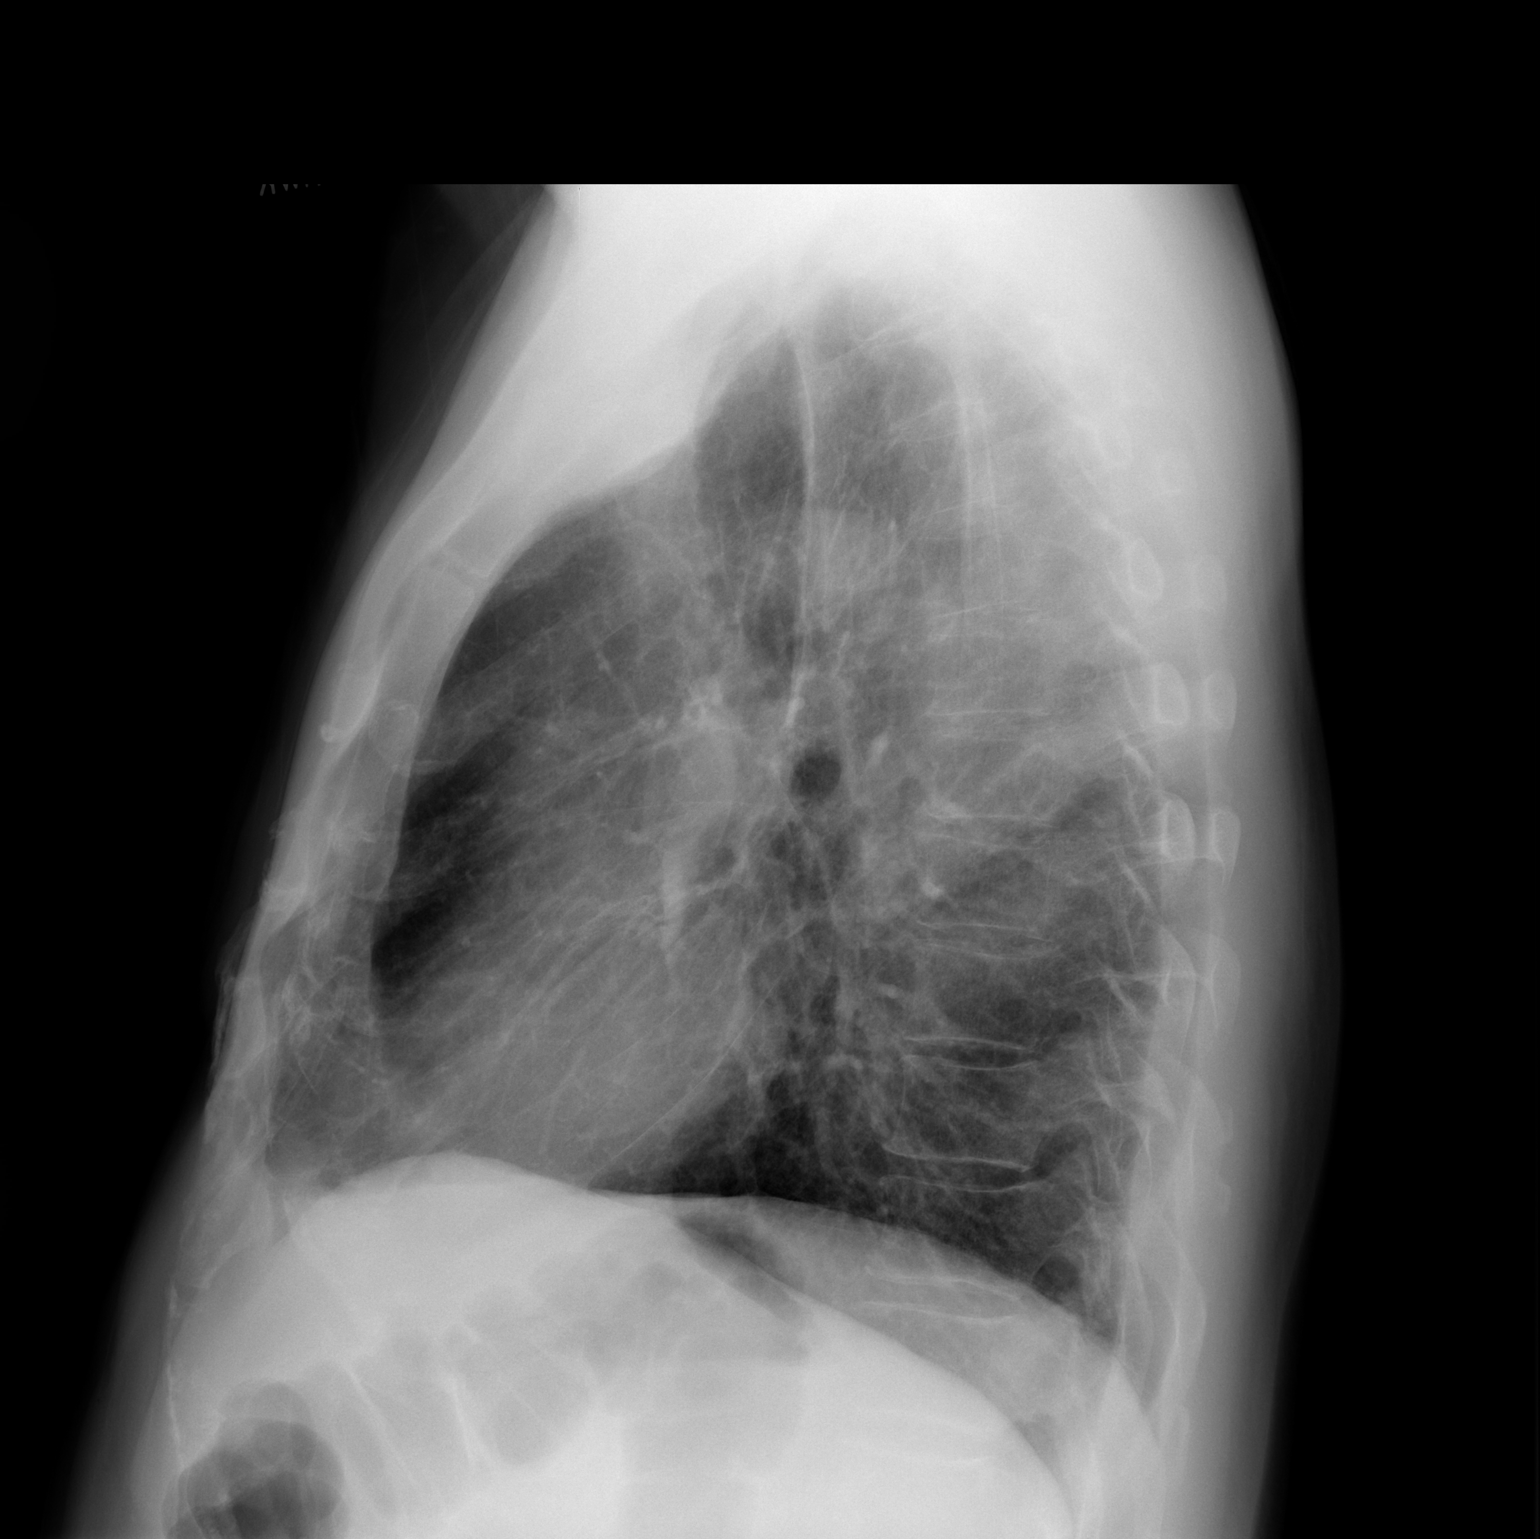

[2 of 2 positions shown; findings below may reference images not displayed]

FINDINGS: Lungs are clear without infiltrate effusion or mass. Negative for
heart failure. Heart size is normal. Left coronary stent.
IMPRESSION: No active cardiopulmonary disease.
